# Patient Record
Sex: Female | Born: 1980 | Race: Black or African American | Hispanic: No | Marital: Married | Smoking: Never smoker
Health system: Southern US, Community
[De-identification: ages and names within clinical notes are randomized; demographics above are authoritative.]

## PROBLEM LIST (undated history)

## (undated) DIAGNOSIS — D649 Anemia, unspecified: Secondary | ICD-10-CM

---

## 1999-05-21 ENCOUNTER — Emergency Department (HOSPITAL_COMMUNITY): Admission: EM | Admit: 1999-05-21 | Discharge: 1999-05-21 | Payer: Self-pay | Admitting: Emergency Medicine

## 1999-05-21 ENCOUNTER — Encounter: Payer: Self-pay | Admitting: Emergency Medicine

## 1999-05-27 ENCOUNTER — Emergency Department (HOSPITAL_COMMUNITY): Admission: EM | Admit: 1999-05-27 | Discharge: 1999-05-27 | Payer: Self-pay | Admitting: Emergency Medicine

## 2002-09-18 ENCOUNTER — Emergency Department (HOSPITAL_COMMUNITY): Admission: EM | Admit: 2002-09-18 | Discharge: 2002-09-18 | Payer: Self-pay | Admitting: Emergency Medicine

## 2010-06-04 ENCOUNTER — Ambulatory Visit: Payer: Self-pay | Admitting: Diagnostic Radiology

## 2010-06-04 ENCOUNTER — Emergency Department (HOSPITAL_BASED_OUTPATIENT_CLINIC_OR_DEPARTMENT_OTHER): Admission: EM | Admit: 2010-06-04 | Discharge: 2010-06-04 | Payer: Self-pay | Admitting: Emergency Medicine

## 2011-08-06 ENCOUNTER — Emergency Department (HOSPITAL_BASED_OUTPATIENT_CLINIC_OR_DEPARTMENT_OTHER)
Admission: EM | Admit: 2011-08-06 | Discharge: 2011-08-06 | Disposition: A | Discharge status: Home / Self Care | Payer: Self-pay | Attending: Emergency Medicine | Admitting: Emergency Medicine

## 2011-08-06 DIAGNOSIS — B349 Viral infection, unspecified: Secondary | ICD-10-CM

## 2011-08-06 DIAGNOSIS — B9789 Other viral agents as the cause of diseases classified elsewhere: Secondary | ICD-10-CM | POA: Insufficient documentation

## 2011-08-06 DIAGNOSIS — IMO0001 Reserved for inherently not codable concepts without codable children: Secondary | ICD-10-CM | POA: Insufficient documentation

## 2011-08-06 LAB — URINALYSIS, ROUTINE W REFLEX MICROSCOPIC
Bilirubin Urine: NEGATIVE
Glucose, UA: NEGATIVE mg/dL
Hgb urine dipstick: NEGATIVE
Ketones, ur: NEGATIVE mg/dL
Leukocytes, UA: NEGATIVE
Nitrite: NEGATIVE
Protein, ur: NEGATIVE mg/dL
Specific Gravity, Urine: 1.023 (ref 1.005–1.030)
Urobilinogen, UA: 0.2 mg/dL (ref 0.0–1.0)
pH: 5.5 (ref 5.0–8.0)

## 2011-08-06 LAB — PREGNANCY, URINE: Preg Test, Ur: NEGATIVE

## 2011-08-06 LAB — RAPID STREP SCREEN (MED CTR MEBANE ONLY): Streptococcus, Group A Screen (Direct): NEGATIVE

## 2011-08-06 MED ORDER — OSELTAMIVIR PHOSPHATE 75 MG PO CAPS: 75.0000 mg | ORAL_CAPSULE | Freq: Two times a day (BID) | ORAL | Status: AC

## 2011-08-06 MED ORDER — KETOROLAC TROMETHAMINE 30 MG/ML IJ SOLN
30.0000 mg | Freq: Once | INTRAMUSCULAR | Status: AC
  Administered 2011-08-06: 30 mg via INTRAVENOUS
  Filled 2011-08-06: qty 1

## 2011-08-06 MED ORDER — SODIUM CHLORIDE 0.9 % IV BOLUS (SEPSIS)
1000.0000 mL | Freq: Once | INTRAVENOUS | Status: AC
  Administered 2011-08-06: 1000 mL via INTRAVENOUS

## 2011-08-06 MED ORDER — IBUPROFEN 600 MG PO TABS: 600.0000 mg | ORAL_TABLET | Freq: Four times a day (QID) | ORAL | Status: AC | PRN

## 2011-08-06 NOTE — ED Notes (Signed)
Pt states that she had sore throat on Thursday, Friday developed chills, fever, and today has generalized body aches, congestion of head and coughing.

## 2011-08-06 NOTE — ED Provider Notes (Addendum)
History     CSN: [id number] Arrival date & time: 08/06/2011  6:18 AM   First MD Initiated Contact with Patient 08/06/11 [phone]      Chief Complaint  Patient presents with  . Influenza   HPI  30yo female previously healthy presents with several complaints. The patient states that 2 days ago she developed a sore throat, fevers which are subjective, chills. She has generalized body aches. She states that when she coughs hard enough she does develop a headache. She complains of rhinorrhea nasal congestion. She denies neck stiffness. She's had multiple sick contacts at her job. She's been taking ibuprofen without relief of symptoms. Denies abdominal pain, nausea, vomiting. Denies pain in her ears. Denies hematuria/dysuria/freq/urgency. Denies back pain.  History reviewed. No pertinent past medical history.  Past Surgical History  Procedure Date  . Cesarean section     History reviewed. No pertinent family history.  History  Substance Use Topics  . Smoking status: Never Smoker   . Smokeless tobacco: Never Used  . Alcohol Use: Yes     occasional use    OB History    Grav Para Term Preterm Abortions TAB SAB Ect Mult Living                  Review of Systems  All other systems reviewed and are negative.  except as noted HPI   Allergies  Review of patient's allergies indicates no known allergies.  Home Medications   Current Outpatient Rx  Name Route Sig Dispense Refill  . COLD AND FLU PO Oral Take 5 mLs by mouth 3 (three) times daily as needed.      . IBUPROFEN 600 MG PO TABS Oral Take 1 tablet (600 mg total) by mouth every 6 (six) hours as needed for pain. 30 tablet 0  . OSELTAMIVIR PHOSPHATE 75 MG PO CAPS Oral Take 1 capsule (75 mg total) by mouth every 12 (twelve) hours. 10 capsule 0    BP 115/78  Pulse 116  Temp(Src) 98.7 F (37.1 C) (Oral)  Resp 20  Ht 5\' 2"  (1.575 m)  Wt 215 lb (97.523 kg)  BMI 39.32 kg/m2  SpO2 98%  LMP 07/09/2011  Physical Exam  Nursing  note and vitals reviewed. Constitutional: She is oriented to person, place, and time. She appears well-developed.  HENT:  Head: Atraumatic.  Mouth/Throat: No oropharyngeal exudate.       Nasal congestion Mild posterior OP erythema TM clear b/l  Eyes: Conjunctivae and EOM are normal. Pupils are equal, round, and reactive to light. Right eye exhibits no discharge. Left eye exhibits no discharge.  Neck: Normal range of motion. Neck supple.  Cardiovascular: Regular rhythm, normal heart sounds and intact distal pulses.  Exam reveals no gallop and no friction rub.   No murmur heard.      tachycardic  Pulmonary/Chest: Effort normal and breath sounds normal. No respiratory distress. She has no wheezes. She has no rales.  Abdominal: Soft. She exhibits no distension. There is no tenderness. There is no rebound and no guarding.  Musculoskeletal: Normal range of motion.  Lymphadenopathy:    She has cervical adenopathy.  Neurological: She is alert and oriented to person, place, and time.  Skin: Skin is warm and dry. No rash noted.  Psychiatric: She has a normal mood and affect.    ED Course  Procedures (including critical care time)  Labs Reviewed  URINALYSIS, ROUTINE W REFLEX MICROSCOPIC - Abnormal; Notable for the following:    APPearance  CLOUDY (*)    All other components within normal limits  PREGNANCY, URINE  RAPID STREP SCREEN  STREP A DNA PROBE   No results found.   1. Viral syndrome     MDM  Viral syndrome v/s strep/mono pharyngitis. Will check rapid strep, U/A, urine preg. IVF and toradol for supportive care given tachycardia. PMD referral.     Forbes Cellar, MD 08/06/11 [phone]    Labs reviewed and unremarkable. Tachycardia improved with IVF, toradol. Sx present for less than 48 hours. Will discharge with tamiflu and ibuprofen.Needs pmd f/u  BP 109/60  Pulse 90  Temp(Src) 98.9 F (37.2 C) (Oral)  Resp 19  Ht 5\' 2"  (1.575 m)  Wt 215 lb (97.523 kg)  BMI 39.32  kg/m2  SpO2 100%  LMP 07/09/2011   Forbes Cellar, MD 08/06/11 [phone]

## 2015-10-23 ENCOUNTER — Encounter (HOSPITAL_BASED_OUTPATIENT_CLINIC_OR_DEPARTMENT_OTHER): Payer: Self-pay

## 2015-10-23 ENCOUNTER — Emergency Department (HOSPITAL_BASED_OUTPATIENT_CLINIC_OR_DEPARTMENT_OTHER)
Admission: EM | Admit: 2015-10-23 | Discharge: 2015-10-23 | Disposition: A | Discharge status: Home / Self Care | Payer: Self-pay | Attending: Emergency Medicine | Admitting: Emergency Medicine

## 2015-10-23 DIAGNOSIS — K0889 Other specified disorders of teeth and supporting structures: Secondary | ICD-10-CM | POA: Insufficient documentation

## 2015-10-23 MED ORDER — ACETAMINOPHEN 325 MG PO TABS
650.0000 mg | ORAL_TABLET | Freq: Once | ORAL | Status: AC
  Administered 2015-10-23: 650 mg via ORAL
  Filled 2015-10-23: qty 2

## 2015-10-23 MED ORDER — PENICILLIN V POTASSIUM 500 MG PO TABS: 500.0000 mg | ORAL_TABLET | Freq: Four times a day (QID) | ORAL | Status: DC

## 2015-10-23 MED ORDER — NAPROXEN 250 MG PO TABS: 250.0000 mg | ORAL_TABLET | Freq: Two times a day (BID) | ORAL | Status: DC

## 2015-10-23 MED FILL — NAPROXEN 250 MG TABLET: 250 | 15 days supply | Qty: 30 | Fill #0

## 2015-10-23 MED FILL — PENICILLIN VK 500 MG TABLET: 500 | 10 days supply | Qty: 40 | Fill #0

## 2015-10-23 NOTE — ED Notes (Signed)
Left upper toothache x 2 days.  

## 2015-10-23 NOTE — ED Provider Notes (Signed)
CSN: [id number]     Arrival date & time 10/23/15  1240 History   First MD Initiated Contact with Patient 10/23/15 1312     Chief Complaint  Patient presents with  . Dental Pain    Kelli Brooks is a 35 y.o. female who presents to the emergency department complaining of dental pain ongoing for several months which is worsened over the past 2 days. She complains of 8 out of 10 left upper dental pain currently. She reports her pain is worse with cold liquids and with eating. She has taken nothing for treatment of her pain today. She denies having a dental provider. She denies fevers, trouble opening her mouth, discharge from her mouth, sore throat, trouble swallowing, neck pain, neck stiffness, ear pain or ear discharge.  Patient is a 35 y.o. female presenting with tooth pain. The history is provided by the patient. No language interpreter was used.  Dental Pain Associated symptoms: no congestion, no drooling, no facial swelling, no fever, no headaches, no neck pain and no oral lesions     History reviewed. No pertinent past medical history. Past Surgical History  Procedure Laterality Date  . Cesarean section     No family history on file. Social History  Substance Use Topics  . Smoking status: Never Smoker   . Smokeless tobacco: Never Used  . Alcohol Use: Yes   OB History    No data available     Review of Systems  Constitutional: Negative for fever and chills.  HENT: Positive for dental problem. Negative for congestion, drooling, ear discharge, ear pain, facial swelling, mouth sores, sore throat and trouble swallowing.   Eyes: Negative for visual disturbance.  Gastrointestinal: Negative for nausea and vomiting.  Musculoskeletal: Negative for neck pain and neck stiffness.  Skin: Negative for rash.  Neurological: Negative for syncope, light-headedness and headaches.      Allergies  Review of patient's allergies indicates no known allergies.  Home Medications   Prior to  Admission medications   Medication Sig Start Date End Date Taking? Authorizing Provider  naproxen (NAPROSYN) 250 MG tablet Take 1 tablet (250 mg total) by mouth 2 (two) times daily with a meal. 10/23/15   Everlene Farrier, PA-C  penicillin v potassium (VEETID) 500 MG tablet Take 1 tablet (500 mg total) by mouth 4 (four) times daily. 10/23/15   Everlene Farrier, PA-C   BP 140/78 mmHg  Pulse 69  Temp(Src) 98.6 F (37 C) (Oral)  Resp 16  Ht  (1.575 m)  Wt 86.183 kg  BMI 34.74 kg/m2  SpO2 100%  LMP 10/15/2015 Physical Exam  Constitutional: She is oriented to person, place, and time. She appears well-developed and well-nourished. No distress.  Non-toxic appearing.   HENT:  Head: Normocephalic and atraumatic.  Right Ear: External ear normal.  Left Ear: External ear normal.  Mouth/Throat: Oropharynx is clear and moist. No oropharyngeal exudate.  Tenderness to left upper tooth. Generally poor dentition.  No discharge from the mouth. No facial swelling.  Uvula is midline without edema. Soft palate rises symmetrically. No tonsillar hypertrophy or exudates. Tongue protrusion is normal. No trismus. Bilateral tympanic membranes are pearly-gray without erythema or loss of landmarks.   Eyes: Conjunctivae are normal. Pupils are equal, round, and reactive to light. Right eye exhibits no discharge. Left eye exhibits no discharge.  Neck: Normal range of motion. Neck supple. No JVD present. No tracheal deviation present.  Cardiovascular: Normal rate, regular rhythm, normal heart sounds and intact distal pulses.  Pulmonary/Chest: Effort normal and breath sounds normal. No respiratory distress. She has no wheezes. She has no rales.  Abdominal: Soft. There is no tenderness.  Lymphadenopathy:    She has no cervical adenopathy.  Neurological: She is alert and oriented to person, place, and time. No cranial nerve deficit. Coordination normal.  Skin: Skin is warm and dry. No rash noted. She is not  diaphoretic. No erythema. No pallor.  Psychiatric: She has a normal mood and affect. Her behavior is normal.  Nursing note and vitals reviewed.   ED Course  Procedures (including critical care time) Labs Review Labs Reviewed - No data to display  Imaging Review No results found.    EKG Interpretation None      Filed Vitals:   10/23/15 1248  BP: 140/78  Pulse: 69  Temp: 98.6 F (37 C)  TempSrc: Oral  Resp: 16  Height:  (1.575 m)  Weight: 86.183 kg  SpO2: 100%     MDM   Meds given in ED:  Medications  acetaminophen (TYLENOL) tablet 650 mg (650 mg Oral Given 10/23/15 1345)    Discharge Medication List as of 10/23/2015  1:44 PM    START taking these medications   Details  naproxen (NAPROSYN) 250 MG tablet Take 1 tablet (250 mg total) by mouth 2 (two) times daily with a meal., Starting 10/23/2015, Until Discontinued, Print    penicillin v potassium (VEETID) 500 MG tablet Take 1 tablet (500 mg total) by mouth 4 (four) times daily., Starting 10/23/2015, Until Discontinued, Print        Final diagnoses:  Pain, dental   This  is a 35 y.o. female who presents to the emergency department complaining of dental pain ongoing for several months which is worsened over the past 2 days. Patient with toothache.  No gross abscess.  Exam unconcerning for Ludwig's angina or spread of infection.  Will treat with penicillin and pain medicine.  Urged patient to follow-up with dentist.  Patient provided with resource guide. I advised the patient to follow-up with their primary care provider this week. I advised the patient to return to the emergency department with new or worsening symptoms or new concerns. The patient verbalized understanding and agreement with plan.        Everlene Farrier, PA-C 10/23/15 1350  Pricilla Loveless, MD 10/26/15 [phone]

## 2015-10-23 NOTE — Discharge Instructions (Signed)

## 2016-06-13 ENCOUNTER — Emergency Department (HOSPITAL_BASED_OUTPATIENT_CLINIC_OR_DEPARTMENT_OTHER)
Admission: EM | Admit: 2016-06-13 | Discharge: 2016-06-13 | Disposition: A | Discharge status: Home / Self Care | Payer: Self-pay | Attending: Emergency Medicine | Admitting: Emergency Medicine

## 2016-06-13 ENCOUNTER — Emergency Department (HOSPITAL_BASED_OUTPATIENT_CLINIC_OR_DEPARTMENT_OTHER): Payer: Self-pay

## 2016-06-13 ENCOUNTER — Encounter (HOSPITAL_BASED_OUTPATIENT_CLINIC_OR_DEPARTMENT_OTHER): Payer: Self-pay | Admitting: Emergency Medicine

## 2016-06-13 DIAGNOSIS — D509 Iron deficiency anemia, unspecified: Secondary | ICD-10-CM | POA: Insufficient documentation

## 2016-06-13 DIAGNOSIS — J209 Acute bronchitis, unspecified: Secondary | ICD-10-CM | POA: Insufficient documentation

## 2016-06-13 LAB — CBC
HEMATOCRIT: 28.4 % — AB (ref 36.0–46.0)
Hemoglobin: 8.3 g/dL — ABNORMAL LOW (ref 12.0–15.0)
MCH: 18 pg — AB (ref 26.0–34.0)
MCHC: 29.2 g/dL — ABNORMAL LOW (ref 30.0–36.0)
MCV: 61.6 fL — AB (ref 78.0–100.0)
PLATELETS: 455 10*3/uL — AB (ref 150–400)
RBC: 4.61 MIL/uL (ref 3.87–5.11)
RDW: 21.4 % — ABNORMAL HIGH (ref 11.5–15.5)
WBC: 10.6 10*3/uL — AB (ref 4.0–10.5)

## 2016-06-13 LAB — BASIC METABOLIC PANEL
Anion gap: 10 (ref 5–15)
BUN: 9 mg/dL (ref 6–20)
CHLORIDE: 106 mmol/L (ref 101–111)
CO2: 21 mmol/L — AB (ref 22–32)
CREATININE: 0.97 mg/dL (ref 0.44–1.00)
Calcium: 9.2 mg/dL (ref 8.9–10.3)
GFR calc Af Amer: >60 mL/min (ref >60)
GFR calc non Af Amer: >60 mL/min (ref >60)
Glucose, Bld: 92 mg/dL (ref 65–99)
POTASSIUM: 3.2 mmol/L — AB (ref 3.5–5.1)
SODIUM: 137 mmol/L (ref 135–145)

## 2016-06-13 LAB — TROPONIN I: Troponin I: <0.03 ng/mL (ref <0.03)

## 2016-06-13 MED ORDER — AZITHROMYCIN 250 MG PO TABS: ORAL_TABLET | ORAL | 0 refills | Status: DC

## 2016-06-13 NOTE — ED Provider Notes (Signed)
[facility]   CSN: [id number] Arrival date & time: 06/13/16  1954   By signing my name below, I, Teofilo PodMatthew P. Jamison, attest that this documentation has been prepared under the direction and in the presence of Geoffery Lyonsouglas Ilya Neely, MD . Electronically Signed: Teofilo PodMatthew P. Jamison, ED Scribe. 06/13/2016. 9:48 PM.   History   Chief Complaint Chief Complaint  Patient presents with  . Cough    The history is provided by the patient. No language interpreter was used.  Cough Associated symptoms include chest pain and shortness of breath. Her past medical history does not include asthma.  Shortness of Breath  This is a new problem. The average episode lasts 3 days. The problem occurs continuously.The problem has not changed since onset.Associated symptoms include cough and chest pain. She has tried nothing for the symptoms. Associated medical issues do not include asthma.   HPI Comments:  Kelli RoupCrystal Brooks is a 35 y.o. female who presents to the Emergency Department complaining of gradual onset, worsening SOB x3 days. Pt complains of associated productive cough and chest pain. Pt states that her chest pain is exacerbated when taking deep breaths. Pt reports PMHx of irregular periods. Pt is not a smoker. No alleviating factors noted. Pt denies other associated symptoms.   History reviewed. No pertinent past medical history.  There are no active problems to display for this patient.   Past Surgical History:  Procedure Laterality Date  . CESAREAN SECTION      OB History    No data available       Home Medications    Prior to Admission medications   Medication Sig Start Date End Date Taking? Authorizing Provider  naproxen (NAPROSYN) 250 MG tablet Take 1 tablet (250 mg total) by mouth 2 (two) times daily with a meal. 10/23/15   Everlene FarrierWilliam Dansie, PA-C  penicillin v potassium (VEETID) 500 MG tablet Take 1 tablet (500 mg total) by mouth 4 (four) times daily. 10/23/15    Everlene FarrierWilliam Dansie, PA-C    Family History History reviewed. No pertinent family history.  Social History Social History  Substance Use Topics  . Smoking status: Never Smoker  . Smokeless tobacco: Never Used  . Alcohol use Yes     Allergies   Review of patient's allergies indicates no known allergies.   Review of Systems Review of Systems  Respiratory: Positive for cough and shortness of breath.   Cardiovascular: Positive for chest pain.  All other systems reviewed and are negative.    Physical Exam Updated Vital Signs BP 119/73 (BP Location: Right Arm)   Pulse 95   Temp 98.3 F (36.8 C) (Oral)   Resp 22   Ht 5\' 2"  (1.575 m)   Wt 218 lb (98.9 kg)   LMP 06/05/2016   SpO2 100%   BMI 39.87 kg/m   Physical Exam  Constitutional: She is oriented to person, place, and time. She appears well-developed and well-nourished. No distress.  HENT:  Head: Normocephalic and atraumatic.  Mouth/Throat: Oropharynx is clear and moist.  Eyes: Conjunctivae are normal.  Neck: Normal range of motion. Neck supple.  Cardiovascular: Normal rate, regular rhythm and normal heart sounds.   No murmur heard. Pulmonary/Chest: Effort normal and breath sounds normal. No respiratory distress. She has no wheezes. She has no rales.  Abdominal: Soft. She exhibits no distension. There is no tenderness.  Musculoskeletal: Normal range of motion.  Neurological: She is alert and oriented to person, place, and time.  Skin: Skin is  warm and dry.  Psychiatric: She has a normal mood and affect.  Nursing note and vitals reviewed.    ED Treatments / Results  DIAGNOSTIC STUDIES:  Oxygen Saturation is 100% on RA, normal by my interpretation.    COORDINATION OF CARE:  9:48 PM Discussed treatment plan with pt at bedside and pt agreed to plan.   Labs (all labs ordered are listed, but only abnormal results are displayed) Labs Reviewed  BASIC METABOLIC PANEL - Abnormal; Notable for the following:        Result Value   Potassium 3.2 (*)    CO2 21 (*)    All other components within normal limits  CBC - Abnormal; Notable for the following:    WBC 10.6 (*)    Hemoglobin 8.3 (*)    HCT 28.4 (*)    MCV 61.6 (*)    MCH 18.0 (*)    MCHC 29.2 (*)    RDW 21.4 (*)    Platelets 455 (*)    All other components within normal limits  TROPONIN I    EKG  EKG Interpretation  Date/Time:  Monday June 13 2016 20:00:25 EDT Ventricular Rate:  99 PR Interval:  144 QRS Duration: 70 QT Interval:  324 QTC Calculation: 415 R Axis:   50 Text Interpretation:  Normal sinus rhythm Cannot rule out Anterior infarct , age undetermined Abnormal ECG Confirmed by Andrei Mccook  MD, Venus Ruhe ([id number]) on 06/13/2016 9:45:40 PM       Radiology Dg Chest 2 View  Result Date: 06/13/2016 CLINICAL DATA:  Mid chest pain, dizziness and shortness of breath for 3 days. Chest congestion. EXAM: CHEST  2 VIEW COMPARISON:  None. FINDINGS: Lungs are clear. Heart size is normal. No pneumothorax or pleural effusion. No bony abnormality. IMPRESSION: Negative chest. Electronically Signed   By: Drusilla Kanner M.D.   On: 06/13/2016 20:54    Procedures Procedures (including critical care time)  Medications Ordered in ED Medications - No data to display   Initial Impression / Assessment and Plan / ED Course  I have reviewed the triage vital signs and the nursing notes.  Pertinent labs & imaging results that were available during my care of the patient were reviewed by me and considered in my medical decision making (see chart for details).  Clinical Course    Workup reveals no evidence for a cardiac etiology. She reports reductive cough for the past several days. I suspect a viral etiology, however an acute bronchitis cannot be excluded. She will be given a prescription for Zithromax and is advised to take this if she is not improving in the next 2 days.  Final Clinical Impressions(s) / ED Diagnoses   Final diagnoses:  None     New Prescriptions New Prescriptions   No medications on file  I personally performed the services described in this documentation, which was scribed in my presence. The recorded information has been reviewed and is accurate.        Geoffery Lyons, MD 06/13/16 2256

## 2016-06-13 NOTE — ED Triage Notes (Signed)
Patient states that she has had pain to her chest with SOb and cough x 2 days

## 2016-06-13 NOTE — ED Notes (Signed)
Pt verbalizes understanding of d/c instructions and denies any further needs at this time. 

## 2016-06-13 NOTE — Discharge Instructions (Signed)
Fill the prescription for Zithromax you have been given if your symptoms are not improving in the next 2-3 days.  Begin taking a multivitamin with iron and follow-up with your primary Dr. regarding your anemia.  Return to the emergency department if your symptoms significantly worsen or change.

## 2017-03-08 ENCOUNTER — Emergency Department (HOSPITAL_BASED_OUTPATIENT_CLINIC_OR_DEPARTMENT_OTHER): Payer: Self-pay

## 2017-03-08 ENCOUNTER — Encounter (HOSPITAL_BASED_OUTPATIENT_CLINIC_OR_DEPARTMENT_OTHER): Payer: Self-pay | Admitting: Emergency Medicine

## 2017-03-08 ENCOUNTER — Emergency Department (HOSPITAL_BASED_OUTPATIENT_CLINIC_OR_DEPARTMENT_OTHER)
Admission: EM | Admit: 2017-03-08 | Discharge: 2017-03-08 | Disposition: A | Discharge status: Home / Self Care | Payer: Self-pay | Attending: Emergency Medicine | Admitting: Emergency Medicine

## 2017-03-08 DIAGNOSIS — M25562 Pain in left knee: Secondary | ICD-10-CM | POA: Insufficient documentation

## 2017-03-08 DIAGNOSIS — R202 Paresthesia of skin: Secondary | ICD-10-CM | POA: Insufficient documentation

## 2017-03-08 DIAGNOSIS — Z79899 Other long term (current) drug therapy: Secondary | ICD-10-CM | POA: Insufficient documentation

## 2017-03-08 DIAGNOSIS — M25561 Pain in right knee: Secondary | ICD-10-CM | POA: Insufficient documentation

## 2017-03-08 DIAGNOSIS — G8929 Other chronic pain: Secondary | ICD-10-CM | POA: Insufficient documentation

## 2017-03-08 DIAGNOSIS — R200 Anesthesia of skin: Secondary | ICD-10-CM | POA: Insufficient documentation

## 2017-03-08 DIAGNOSIS — M79673 Pain in unspecified foot: Secondary | ICD-10-CM

## 2017-03-08 LAB — CBG MONITORING, ED: Glucose-Capillary: 91 mg/dL (ref 65–99)

## 2017-03-08 MED ORDER — IBUPROFEN 800 MG PO TABS: 800.0000 mg | ORAL_TABLET | Freq: Three times a day (TID) | ORAL | 0 refills | Status: DC | PRN

## 2017-03-08 MED ORDER — PREDNISONE 50 MG PO TABS: 50.0000 mg | ORAL_TABLET | Freq: Every day | ORAL | 0 refills | Status: DC

## 2017-03-08 MED ORDER — TRAMADOL HCL 50 MG PO TABS: 50.0000 mg | ORAL_TABLET | Freq: Four times a day (QID) | ORAL | 0 refills | Status: DC | PRN

## 2017-03-08 NOTE — ED Provider Notes (Signed)
[facility]   CSN: [id number] Arrival date & time: 03/08/17  0931     History   Chief Complaint Chief Complaint  Patient presents with  . Knee Pain    HPI Kelli Brooks is a 36 y.o. female.  HPI Patient presents to the emergency department with bilateral knee discomfort that appears to be chronic.  She states that last night she developed shooting pain and her feet with tingling.  She states that she has never had this type of tingling in her foot.  She states the bottom of her feet over she feels the tingling pain.  Patient states that nothing seems make the condition better, movement and palpation makes the pain worse.  Patient states that she did not take any medications prior to arrival.  She states that she does not recall any specific injury.  She states she does stand a lot with work History reviewed. No pertinent past medical history.  There are no active problems to display for this patient.   Past Surgical History:  Procedure Laterality Date  . CESAREAN SECTION      OB History    No data available       Home Medications    Prior to Admission medications   Medication Sig Start Date End Date Taking? Authorizing Provider  azithromycin (ZITHROMAX Z-PAK) 250 MG tablet 2 po day one, then 1 daily x 4 days 06/13/16   Geoffery Lyons, MD  naproxen (NAPROSYN) 250 MG tablet Take 1 tablet (250 mg total) by mouth 2 (two) times daily with a meal. 10/23/15   Everlene Farrier, PA-C  penicillin v potassium (VEETID) 500 MG tablet Take 1 tablet (500 mg total) by mouth 4 (four) times daily. 10/23/15   Everlene Farrier, PA-C    Family History No family history on file.  Social History Social History  Substance Use Topics  . Smoking status: Never Smoker  . Smokeless tobacco: Never Used  . Alcohol use Yes     Allergies   Patient has no known allergies.   Review of Systems Review of Systems  All other systems negative except as documented in  the HPI. All pertinent positives and negatives as reviewed in the HPI.  Physical Exam Updated Vital Signs BP (!) 149/99 (BP Location: Left Arm)   Pulse 88   Temp 98.3 F (36.8 C) (Oral)   Resp 18   Ht 5\' 2"  (1.575 m)   Wt 90.7 kg (200 lb)   LMP 02/21/2017   SpO2 98%   BMI 36.58 kg/m   Physical Exam  Constitutional: She is oriented to person, place, and time. She appears well-developed and well-nourished. No distress.  HENT:  Head: Normocephalic and atraumatic.  Eyes: Pupils are equal, round, and reactive to light.  Pulmonary/Chest: Effort normal.  Musculoskeletal:       Right knee: She exhibits normal range of motion, no swelling, no effusion, no ecchymosis, no deformity, no erythema and no bony tenderness.       Left knee: She exhibits normal range of motion, no swelling, no effusion, no ecchymosis, no deformity, no erythema and no bony tenderness.       Feet:  Neurological: She is alert and oriented to person, place, and time.  Skin: Skin is warm and dry.  Psychiatric: She has a normal mood and affect.  Nursing note and vitals reviewed.    ED Treatments / Results  Labs (all labs ordered are listed, but only abnormal results are displayed) Labs  Reviewed - No data to display  EKG  EKG Interpretation None       Radiology No results found.  Procedures Procedures (including critical care time)  Medications Ordered in ED Medications - No data to display   Initial Impression / Assessment and Plan / ED Course  I have reviewed the triage vital signs and the nursing notes.  Pertinent labs & imaging results that were available during my care of the patient were reviewed by me and considered in my medical decision making (see chart for details).     The patient's x-rays do not show any abnormality.  The patient does have pain along the plantar aspect of her feet, which could be related to her standing and walking at work.  We will give her anti-inflammatories,  told to use ice and heat on the areas that are sore.  We will give her orthopedic follow-up as well  Final Clinical Impressions(s) / ED Diagnoses   Final diagnoses:  None    New Prescriptions New Prescriptions   No medications on file     Charlestine NightLawyer, [place], Cordelia Poche-C 03/08/17 1042    Jacalyn LefevreHaviland, Julie, MD 03/08/17 1100

## 2017-03-08 NOTE — ED Triage Notes (Signed)
Pt reports bilateral knee pain that woke her up in the night last night.  States pain is worse with movements and that she has had some pins and needles in her feet also since the pain started last night.  Pt reports bilateral knee pains that come and go for past few years, but that usually it is not so intense.

## 2017-03-08 NOTE — Discharge Instructions (Signed)
Your x-rays were normal. Follow up with the orthopedist provided. Use ice and heat on the areas that are sore

## 2017-10-14 ENCOUNTER — Other Ambulatory Visit: Payer: Self-pay

## 2017-10-14 ENCOUNTER — Emergency Department (HOSPITAL_BASED_OUTPATIENT_CLINIC_OR_DEPARTMENT_OTHER)
Admission: EM | Admit: 2017-10-14 | Discharge: 2017-10-14 | Disposition: A | Discharge status: Home / Self Care | Payer: BLUE CROSS/BLUE SHIELD | Attending: Emergency Medicine | Admitting: Emergency Medicine

## 2017-10-14 ENCOUNTER — Emergency Department (HOSPITAL_BASED_OUTPATIENT_CLINIC_OR_DEPARTMENT_OTHER): Payer: BLUE CROSS/BLUE SHIELD

## 2017-10-14 ENCOUNTER — Encounter (HOSPITAL_BASED_OUTPATIENT_CLINIC_OR_DEPARTMENT_OTHER): Payer: Self-pay | Admitting: Emergency Medicine

## 2017-10-14 DIAGNOSIS — M25561 Pain in right knee: Secondary | ICD-10-CM | POA: Insufficient documentation

## 2017-10-14 DIAGNOSIS — Z79899 Other long term (current) drug therapy: Secondary | ICD-10-CM | POA: Diagnosis not present

## 2017-10-14 MED ORDER — IBUPROFEN 400 MG PO TABS
400.0000 mg | ORAL_TABLET | Freq: Once | ORAL | Status: AC
Start: 2017-10-14 — End: 2017-10-14
  Administered 2017-10-14: 400 mg via ORAL
  Filled 2017-10-14: qty 1

## 2017-10-14 NOTE — ED Provider Notes (Addendum)
[facility] EMERGENCY DEPARTMENT Provider Note   CSN: [id number] Arrival date & time: 10/14/17  1002     History   Chief Complaint Chief Complaint  Patient presents with  . Knee Pain    HPI Kelli Brooks is a 37 y.o. female.  Planes of right anterior knee pain for the past 2 weeks.  Pain is worse worse with weightbearing and improved with nonweightbearing no fever.  No injury.  Also with swelling over anterior knee for the past 2 weeks.  No treatment prior to coming here.  No other associated symptoms.  She reports she has intermittent knee pain that goes "from one need to the other" for the past 1-2 years.  Has never sought medical care before.  HPI  History reviewed. No pertinent past medical history.  There are no active problems to display for this patient.   Past Surgical History:  Procedure Laterality Date  . CESAREAN SECTION      OB History    No data available       Home Medications    Prior to Admission medications   Medication Sig Start Date End Date Taking? Authorizing Provider  azithromycin (ZITHROMAX Z-PAK) 250 MG tablet 2 po day one, then 1 daily x 4 days 06/13/16   Geoffery Lyonselo, Douglas, MD  ibuprofen (ADVIL,MOTRIN) 800 MG tablet Take 1 tablet (800 mg total) by mouth every 8 (eight) hours as needed (start taking once you have finished the steroids). 03/08/17   Lawyer, Cristal Deerhristopher, PA-C  naproxen (NAPROSYN) 250 MG tablet Take 1 tablet (250 mg total) by mouth 2 (two) times daily with a meal. 10/23/15   Everlene Farrieransie, William, PA-C  penicillin v potassium (VEETID) 500 MG tablet Take 1 tablet (500 mg total) by mouth 4 (four) times daily. 10/23/15   Everlene Farrieransie, William, PA-C  predniSONE (DELTASONE) 50 MG tablet Take 1 tablet (50 mg total) by mouth daily with breakfast. 03/08/17   Lawyer, Cristal Deerhristopher, PA-C  traMADol (ULTRAM) 50 MG tablet Take 1 tablet (50 mg total) by mouth every 6 (six) hours as needed for severe pain. 03/08/17   Lawyer, Cristal Deerhristopher, PA-C    Family  History No family history on file.  Social History Social History   Tobacco Use  . Smoking status: Never Smoker  . Smokeless tobacco: Never Used  Substance Use Topics  . Alcohol use: Yes  . Drug use: No     Allergies   Patient has no known allergies.   Review of Systems Review of Systems  Constitutional: Negative.   HENT: Negative.   Respiratory: Negative.   Cardiovascular: Negative.   Gastrointestinal: Negative.   Musculoskeletal: Positive for arthralgias.  Skin: Negative.   Neurological: Negative.   Psychiatric/Behavioral: Negative.   All other systems reviewed and are negative.    Physical Exam Updated Vital Signs BP (!) 145/98 (BP Location: Left Arm)   Pulse 81   Temp 98.7 F (37.1 C) (Oral)   Resp 16   Ht 5\' 2"  (1.575 m)   Wt 90.7 kg (200 lb)   LMP 09/24/2017   SpO2 98%   BMI 36.58 kg/m   Physical Exam  Constitutional: She is oriented to person, place, and time. She appears well-developed and well-nourished.  HENT:  Head: Normocephalic and atraumatic.  Eyes: Conjunctivae are normal. Pupils are equal, round, and reactive to light.  Neck: Neck supple. No tracheal deviation present. No thyromegaly present.  Cardiovascular: Normal rate and regular rhythm.  No murmur heard. Pulmonary/Chest: Effort normal and breath sounds normal.  Abdominal: Soft. Bowel sounds are normal. She exhibits no distension. There is no tenderness.  Obese  Musculoskeletal: Normal range of motion. She exhibits no edema or tenderness.  Right lower extremity minimally swollen over prepatellar area no definite effusion.  Knee is minimally tender over prepatellar area.  It is not red or hot.  All other extremities without redness swelling or tenderness neurovascular intact.  Neurological: She is alert and oriented to person, place, and time. No cranial nerve deficit. Coordination normal.  Walks with limp favoring right leg  Skin: Skin is warm and dry. Capillary refill takes less than  2 seconds. No rash noted.  Psychiatric: She has a normal mood and affect.  Nursing note and vitals reviewed.    ED Treatments / Results  Labs (all labs ordered are listed, but only abnormal results are displayed) Labs Reviewed - No data to display  EKG  EKG Interpretation None       Radiology No results found.  Procedures Procedures (including critical care time)  Medications Ordered in ED Medications  ibuprofen (ADVIL,MOTRIN) tablet 400 mg (not administered)   X-ray viewed by me Results for orders placed or performed during the hospital encounter of 03/08/17  CBG monitoring, ED  Result Value Ref Range   Glucose-Capillary 91 65 - 99 mg/dL   Dg Knee Complete 4 Views Right  Result Date: 10/14/2017 CLINICAL DATA:  Right knee pain.  No injury EXAM: RIGHT KNEE - COMPLETE 4+ VIEW COMPARISON:  03/08/2017 FINDINGS: No evidence of fracture, dislocation, or joint effusion. No evidence of arthropathy or other focal bone abnormality. Soft tissues are unremarkable. IMPRESSION: Negative. Electronically Signed   By: Charlett Nose M.D.   On: 10/14/2017 10:56   Initial Impression / Assessment and Plan / ED Course  I have reviewed the triage vital signs and the nursing notes.  Pertinent labs & imaging results that were available during my care of the patient were reviewed by me and considered in my medical decision making (see chart for details).     11:50 AM pain improved after treatment with ibuprofen.  Plan crutches, ice, referral Dr. Pearletha Forge as needed  Final Clinical Impressions(s) / ED Diagnoses  Diagnosis  Acute right knee pain Final diagnoses:  None    ED Discharge Orders    None       Doug Sou, MD 10/14/17 1210    Doug Sou, MD 10/14/17 [phone]

## 2017-10-14 NOTE — ED Triage Notes (Signed)
R knee pain and swelling x 2 weeks. Denies injury.

## 2017-10-14 NOTE — Discharge Instructions (Signed)
Plan ice pack over your right knee 4 times daily for 30 minutes at a time.  Take Advil as directed for pain. use the crutches as needed for comfort.  If you continue to have significant pain or difficulty walking in 2 days call Dr.Hudnall schedule the next available office visit.  Tell office staff that you were seen here.

## 2020-04-02 ENCOUNTER — Emergency Department (HOSPITAL_BASED_OUTPATIENT_CLINIC_OR_DEPARTMENT_OTHER): Payer: Self-pay

## 2020-04-02 ENCOUNTER — Other Ambulatory Visit: Payer: Self-pay

## 2020-04-02 ENCOUNTER — Emergency Department (HOSPITAL_BASED_OUTPATIENT_CLINIC_OR_DEPARTMENT_OTHER)
Admission: EM | Admit: 2020-04-02 | Discharge: 2020-04-02 | Disposition: A | Discharge status: Home / Self Care | Payer: Self-pay | Attending: Emergency Medicine | Admitting: Emergency Medicine

## 2020-04-02 ENCOUNTER — Encounter (HOSPITAL_BASED_OUTPATIENT_CLINIC_OR_DEPARTMENT_OTHER): Payer: Self-pay

## 2020-04-02 DIAGNOSIS — Y999 Unspecified external cause status: Secondary | ICD-10-CM | POA: Insufficient documentation

## 2020-04-02 DIAGNOSIS — S99921A Unspecified injury of right foot, initial encounter: Secondary | ICD-10-CM | POA: Insufficient documentation

## 2020-04-02 DIAGNOSIS — Y939 Activity, unspecified: Secondary | ICD-10-CM | POA: Insufficient documentation

## 2020-04-02 DIAGNOSIS — Y9289 Other specified places as the place of occurrence of the external cause: Secondary | ICD-10-CM | POA: Insufficient documentation

## 2020-04-02 DIAGNOSIS — W010XXA Fall on same level from slipping, tripping and stumbling without subsequent striking against object, initial encounter: Secondary | ICD-10-CM | POA: Insufficient documentation

## 2020-04-02 DIAGNOSIS — M79604 Pain in right leg: Secondary | ICD-10-CM

## 2020-04-02 MED ORDER — ACETAMINOPHEN 325 MG PO TABS
650.0000 mg | ORAL_TABLET | Freq: Once | ORAL | Status: AC
  Administered 2020-04-02: 17:00:00 650 mg via ORAL
  Filled 2020-04-02: qty 2

## 2020-04-02 NOTE — ED Provider Notes (Signed)
[facility] EMERGENCY DEPARTMENT Provider Note   CSN: [id number] Arrival date & time: 04/02/20  1444     History Chief Complaint  Patient presents with  . Foot Injury    Kelli Brooks is a 39 y.o. female with no pertinent past medical history that presents the emergency department today for right foot pain. Patient states that she was seen in urgent care 6 days ago and diagnosed with a " slight fracture" and was prescribed a cam walker.  Unable to see those records at this time.  Patient states that since then her foot has been hurting worse, states that she has been using the cam walker as prescribed.  Has not been taking anything for this.  States that she is not going to follow-up with orthopedics due to financial concerns.  Denies any fevers, chills, nausea, vomiting, paresthesias, weakness.  Patient states that she fell 6 days ago when she was trying to get off her porch.  States that she twisted her ankle,   States that she thinks she also hit her shin, they did not do x-rays at this at the time she states.  Did not hit her head or anything else.  HPI     History reviewed. No pertinent past medical history.  There are no problems to display for this patient.   Past Surgical History:  Procedure Laterality Date  . CESAREAN SECTION       OB History   No obstetric history on file.     No family history on file.  Social History   Tobacco Use  . Smoking status: Never Smoker  . Smokeless tobacco: Never Used  Vaping Use  . Vaping Use: Never used  Substance Use Topics  . Alcohol use: Yes    Comment: occ  . Drug use: No    Home Medications Prior to Admission medications   Medication Sig Start Date End Date Taking? Authorizing Provider  ibuprofen (ADVIL,MOTRIN) 800 MG tablet Take 1 tablet (800 mg total) by mouth every 8 (eight) hours as needed (start taking once you have finished the steroids). 03/08/17   Lawyer, Cristal Deer, PA-C  naproxen (NAPROSYN) 250  MG tablet Take 1 tablet (250 mg total) by mouth 2 (two) times daily with a meal. 10/23/15   Everlene Farrier, PA-C  penicillin v potassium (VEETID) 500 MG tablet Take 1 tablet (500 mg total) by mouth 4 (four) times daily. 10/23/15   Everlene Farrier, PA-C  traMADol (ULTRAM) 50 MG tablet Take 1 tablet (50 mg total) by mouth every 6 (six) hours as needed for severe pain. 03/08/17   Charlestine Night, PA-C    Allergies    Patient has no known allergies.  Review of Systems   Review of Systems  Constitutional: Negative for diaphoresis, fatigue and fever.  Eyes: Negative for visual disturbance.  Respiratory: Negative for shortness of breath.   Cardiovascular: Negative for chest pain.  Gastrointestinal: Negative for nausea and vomiting.  Musculoskeletal: Positive for arthralgias (R pain in foot, ankle, leg ). Negative for back pain and myalgias.  Skin: Negative for color change, pallor, rash and wound.  Neurological: Negative for syncope, weakness, light-headedness, numbness and headaches.  Psychiatric/Behavioral: Negative for behavioral problems and confusion.    Physical Exam Updated Vital Signs BP (!) 130/88 (BP Location: Left Arm)   Pulse 75   Temp 99.1 F (37.3 C) (Oral)   Resp 16   Ht 5\' 2"  (1.575 m)   Wt 88.5 kg   LMP 03/04/2020  SpO2 100%   BMI 35.67 kg/m   Physical Exam Constitutional:      General: She is not in acute distress.    Appearance: Normal appearance. She is not ill-appearing, toxic-appearing or diaphoretic.  HENT:     Mouth/Throat:     Mouth: Mucous membranes are moist.     Pharynx: Oropharynx is clear.  Eyes:     General: No scleral icterus.    Extraocular Movements: Extraocular movements intact.     Pupils: Pupils are equal, round, and reactive to light.  Cardiovascular:     Rate and Rhythm: Normal rate and regular rhythm.     Pulses: Normal pulses.     Heart sounds: Normal heart sounds.  Pulmonary:     Effort: Pulmonary effort is normal. No  respiratory distress.     Breath sounds: Normal breath sounds. No stridor. No wheezing, rhonchi or rales.  Chest:     Chest wall: No tenderness.  Abdominal:     General: Abdomen is flat. There is no distension.     Palpations: Abdomen is soft.     Tenderness: There is no abdominal tenderness. There is no guarding or rebound.  Musculoskeletal:        General: Tenderness present. No swelling. Normal range of motion.     Cervical back: Normal range of motion and neck supple. No rigidity.     Right lower leg: No edema.     Left lower leg: No edema.     Comments: Right side with tenderness over midfoot, no erythema or swelling noticed.  No ecchymosis noted.  No warmth noticed.  PT pulses 2+ and equal, used with Doppler.  Normal strength to toes, ankle, leg.  Patient is also having tenderness over shin, no tenderness to knee.  Compartments soft, normal leg raise.  Able to range leg in all directions without pain.  No ecchymosis or temperature changes or pallor noted over shin.  Normal strength of knee.  Normal sensation throughout.  Left side normal.  Skin:    General: Skin is warm and dry.     Capillary Refill: Capillary refill takes less than 2 seconds.     Coloration: Skin is not pale.  Neurological:     General: No focal deficit present.     Mental Status: She is alert and oriented to person, place, and time.     Cranial Nerves: No cranial nerve deficit.     Sensory: No sensory deficit.     Motor: No weakness.     Coordination: Coordination normal.     Gait: Gait normal.  Psychiatric:        Mood and Affect: Mood normal.        Behavior: Behavior normal.     ED Results / Procedures / Treatments   Labs (all labs ordered are listed, but only abnormal results are displayed) Labs Reviewed - No data to display  EKG None  Radiology DG Tibia/Fibula Right  Result Date: 04/02/2020 CLINICAL DATA:  Fall from porch after tripping over object. Right foot, ankle, and lower leg pain. EXAM:  RIGHT TIBIA AND FIBULA - 2 VIEW COMPARISON:  None. FINDINGS: Cortical margins of the tibia and fibular intact. There is no evidence of fracture or other focal bone lesions. Knee alignment is maintained. Mild lateral soft tissue edema. IMPRESSION: Soft tissue edema without acute fracture. Electronically Signed   By: Narda Rutherford M.D.   On: 04/02/2020 17:19   DG Ankle Complete Right  Result Date: 04/02/2020 CLINICAL  DATA:  Fall from porch after tripping over object. Right foot, ankle, and lower leg pain. EXAM: RIGHT ANKLE - COMPLETE 3+ VIEW COMPARISON:  None. FINDINGS: There is no evidence of fracture, dislocation, or joint effusion. There is no evidence of arthropathy or other focal bone abnormality. Soft tissues are unremarkable. IMPRESSION: Negative radiographs of the right ankle. Electronically Signed   By: Narda Rutherford M.D.   On: 04/02/2020 17:17   DG Foot Complete Right  Result Date: 04/02/2020 CLINICAL DATA:  Fall from porch after tripping over object. Right foot, ankle, and lower leg pain. EXAM: RIGHT FOOT COMPLETE - 3+ VIEW COMPARISON:  Foot radiograph 03/08/2017 FINDINGS: There is no evidence of fracture or dislocation. Minimal degenerative change of the medial midfoot. Minimal hallux valgus. Soft tissues are unremarkable. IMPRESSION: No fracture or subluxation of the right foot. Electronically Signed   By: Narda Rutherford M.D.   On: 04/02/2020 17:18    Procedures Procedures (including critical care time)  Medications Ordered in ED Medications  acetaminophen (TYLENOL) tablet 650 mg (650 mg Oral Given 04/02/20 1645)    ED Course  I have reviewed the triage vital signs and the nursing notes.  Pertinent labs & imaging results that were available during my care of the patient were reviewed by me and considered in my medical decision making (see chart for details).    MDM Rules/Calculators/A&P                          Kelli Brooks is a 39 y.o. female with no pertinent past  medical history that presents the emergency department today for right foot pain.  Plain films show no fractures or dislocations on foot, ankle, tibia-fibula.  Mild swelling in tib-fib, no fractures noticed.  No signs of compartment syndrome.  Pulses PT 2+ Doppler used.  Patient given Tylenol for pain. Upon reassessment patient states that pain feels better with Tylenol on board.  Patient able to ambulate with cam walker.  Did discuss importance of follow-up with [facility], spoke to her that this is a walk-in clinic she states that she will see them.  Discussed RICE protocol with patient, patient agreeable.  Since patient is having pain to midfoot, will keep patient in cam walker until she sees Ortho.  Patient ready for discharge at this time.  Discussed return precautions regarding compartment syndrome, patient expressed understanding.  Doubt need for further emergent work up at this time. I explained the diagnosis and have given explicit precautions to return to the ER including for any other new or worsening symptoms. The patient understands and accepts the medical plan as it's been dictated and I have answered their questions. Discharge instructions concerning home care and prescriptions have been given. The patient is STABLE and is discharged to home in good condition.   Final Clinical Impression(s) / ED Diagnoses Final diagnoses:  Leg pain, anterior, right  Injury of right foot, initial encounter    Rx / DC Orders ED Discharge Orders    None       Farrel Gordon, PA-C 04/02/20 1808    Virgina Norfolk, DO 04/02/20 2139

## 2020-04-02 NOTE — ED Triage Notes (Addendum)
Pt states she injured right foot 6 days ago-seen at [facility] 7/26-was advised "slight fracture"-wearing a cam walker-states she is still having pain to right foot and now having pain to tib/fib area-NAD-limping gait

## 2020-04-02 NOTE — Discharge Instructions (Addendum)
I need you to call [facility] to follow-up.  Continue to wear your boot for the next week, follow the below directions.  If you have any new or worsening concerning symptoms please come back to the emergency department.  These can include if your feet become numb or turn blue, your leg becomes cold, the pain becomes more severe.  Tests performed today include: An x-ray of the affected area - does NOT show any broken bones or dislocations.  Vital signs. See below for your results today.   Home care instructions: -- *PRICE in the first 24-48 hours after injury: Protect (with brace, splint, sling), if given by your provider Rest Ice- Do not apply ice pack directly to your skin, place towel or similar between your skin and ice/ice pack. Apply ice for 20 min, then remove for 40 min while awake Compression- Wear brace, elastic bandage, splint as directed by your provider Elevate affected extremity above the level of your heart when not walking around for the first 24-48 hours   Use Ibuprofen (Motrin/Advil) 600mg  every 6 hours as needed for pain (do not exceed max dose in 24 hours, 2400mg )  Follow-up instructions: Please follow-up with your primary care provider or the provided orthopedic physician (bone specialist) if you continue to have significant pain in 1 week. In this case you may have a more severe injury that requires further care.   Return instructions:  Please return if your toes or feet are numb or tingling, appear gray or blue, or you have severe pain (also elevate the leg and loosen splint or wrap if you were given one) Please return to the Emergency Department if you experience worsening symptoms.  Please return if you have any other emergent concerns. Additional Information:  Your vital signs today were: BP (!) 130/88 (BP Location: Left Arm)   Pulse 75   Temp 99.1 F (37.3 C) (Oral)   Resp 16   Ht 5\' 2"  (1.575 m)   Wt 88.5 kg   LMP 03/04/2020   SpO2 100%   BMI 35.67 kg/m    If your blood pressure (BP) was elevated above 135/85 this visit, please have this repeated by your doctor within one month. ---------------

## 2020-11-26 ENCOUNTER — Observation Stay (HOSPITAL_COMMUNITY)
Admission: EM | Admit: 2020-11-26 | Discharge: 2020-11-28 | Disposition: A | Discharge status: Home / Self Care | Payer: Self-pay | Attending: Family Medicine | Admitting: Family Medicine

## 2020-11-26 ENCOUNTER — Emergency Department (HOSPITAL_COMMUNITY): Payer: Self-pay

## 2020-11-26 ENCOUNTER — Encounter (HOSPITAL_COMMUNITY): Payer: Self-pay | Admitting: Internal Medicine

## 2020-11-26 ENCOUNTER — Observation Stay (HOSPITAL_COMMUNITY): Payer: Self-pay

## 2020-11-26 DIAGNOSIS — E876 Hypokalemia: Secondary | ICD-10-CM

## 2020-11-26 DIAGNOSIS — N289 Disorder of kidney and ureter, unspecified: Secondary | ICD-10-CM | POA: Insufficient documentation

## 2020-11-26 DIAGNOSIS — N9489 Other specified conditions associated with female genital organs and menstrual cycle: Secondary | ICD-10-CM

## 2020-11-26 DIAGNOSIS — R079 Chest pain, unspecified: Secondary | ICD-10-CM

## 2020-11-26 DIAGNOSIS — D75839 Thrombocytosis, unspecified: Secondary | ICD-10-CM

## 2020-11-26 DIAGNOSIS — Z20822 Contact with and (suspected) exposure to covid-19: Secondary | ICD-10-CM | POA: Insufficient documentation

## 2020-11-26 DIAGNOSIS — N920 Excessive and frequent menstruation with regular cycle: Secondary | ICD-10-CM | POA: Diagnosis present

## 2020-11-26 DIAGNOSIS — D649 Anemia, unspecified: Principal | ICD-10-CM

## 2020-11-26 DIAGNOSIS — R112 Nausea with vomiting, unspecified: Secondary | ICD-10-CM

## 2020-11-26 DIAGNOSIS — R55 Syncope and collapse: Secondary | ICD-10-CM

## 2020-11-26 LAB — CBC WITH DIFFERENTIAL/PLATELET
Abs Immature Granulocytes: 0 10*3/uL (ref 0.00–0.07)
Basophils Absolute: 0.2 10*3/uL — ABNORMAL HIGH (ref 0.0–0.1)
Basophils Relative: 3 %
Eosinophils Absolute: 0.1 10*3/uL (ref 0.0–0.5)
Eosinophils Relative: 2 %
HCT: 23.5 % — ABNORMAL LOW (ref 36.0–46.0)
Hemoglobin: 5.8 g/dL — CL (ref 12.0–15.0)
Lymphocytes Relative: 21 %
Lymphs Abs: 1.1 10*3/uL (ref 0.7–4.0)
MCH: 15.3 pg — ABNORMAL LOW (ref 26.0–34.0)
MCHC: 24.7 g/dL — ABNORMAL LOW (ref 30.0–36.0)
MCV: 61.8 fL — ABNORMAL LOW (ref 80.0–100.0)
Monocytes Absolute: 0 10*3/uL — ABNORMAL LOW (ref 0.1–1.0)
Monocytes Relative: 0 %
Neutro Abs: 3.8 10*3/uL (ref 1.7–7.7)
Neutrophils Relative %: 74 %
Platelets: 927 10*3/uL (ref 150–400)
RBC: 3.8 MIL/uL — ABNORMAL LOW (ref 3.87–5.11)
RDW: 23.4 % — ABNORMAL HIGH (ref 11.5–15.5)
WBC: 5.2 10*3/uL (ref 4.0–10.5)
nRBC: 0 % (ref 0.0–0.2)
nRBC: 0 /100 WBC

## 2020-11-26 LAB — TROPONIN I (HIGH SENSITIVITY): Troponin I (High Sensitivity): 11 ng/L (ref <18)

## 2020-11-26 LAB — COMPREHENSIVE METABOLIC PANEL
ALT: 16 U/L (ref 0–44)
AST: 24 U/L (ref 15–41)
Albumin: 3.8 g/dL (ref 3.5–5.0)
Alkaline Phosphatase: 51 U/L (ref 38–126)
Anion gap: 7 (ref 5–15)
BUN: 12 mg/dL (ref 6–20)
CO2: 25 mmol/L (ref 22–32)
Calcium: 9.1 mg/dL (ref 8.9–10.3)
Chloride: 107 mmol/L (ref 98–111)
Creatinine, Ser: 1.06 mg/dL — ABNORMAL HIGH (ref 0.44–1.00)
GFR, Estimated: >60 mL/min (ref >60)
Glucose, Bld: 103 mg/dL — ABNORMAL HIGH (ref 70–99)
Potassium: 3.4 mmol/L — ABNORMAL LOW (ref 3.5–5.1)
Sodium: 139 mmol/L (ref 135–145)
Total Bilirubin: 0.4 mg/dL (ref 0.3–1.2)
Total Protein: 7.1 g/dL (ref 6.5–8.1)

## 2020-11-26 LAB — SARS CORONAVIRUS 2 (TAT 6-24 HRS): SARS Coronavirus 2: NEGATIVE

## 2020-11-26 LAB — RETICULOCYTES
Immature Retic Fract: 24.9 % — ABNORMAL HIGH (ref 2.3–15.9)
RBC.: 3.56 MIL/uL — ABNORMAL LOW (ref 3.87–5.11)
Retic Count, Absolute: 58 10*3/uL (ref 19.0–186.0)
Retic Ct Pct: 1.6 % (ref 0.4–3.1)

## 2020-11-26 LAB — TYPE AND SCREEN
ABO/RH(D): O POS
Unit division: 0

## 2020-11-26 LAB — URINALYSIS, ROUTINE W REFLEX MICROSCOPIC
Bilirubin Urine: NEGATIVE
Glucose, UA: NEGATIVE mg/dL
Ketones, ur: NEGATIVE mg/dL
Nitrite: NEGATIVE
Protein, ur: NEGATIVE mg/dL
RBC / HPF: >50 RBC/hpf — ABNORMAL HIGH (ref 0–5)
Specific Gravity, Urine: 1.01 (ref 1.005–1.030)
pH: 6 (ref 5.0–8.0)

## 2020-11-26 LAB — FOLATE: Folate: 6.4 ng/mL (ref >5.9)

## 2020-11-26 LAB — IRON AND TIBC
Iron: 10 ug/dL — ABNORMAL LOW (ref 28–170)
Saturation Ratios: 2 % — ABNORMAL LOW (ref 10.4–31.8)
TIBC: 458 ug/dL — ABNORMAL HIGH (ref 250–450)
UIBC: 448 ug/dL

## 2020-11-26 LAB — VITAMIN B12: Vitamin B-12: 522 pg/mL (ref 180–914)

## 2020-11-26 LAB — LIPASE, BLOOD: Lipase: 44 U/L (ref 11–51)

## 2020-11-26 LAB — I-STAT BETA HCG BLOOD, ED (MC, WL, AP ONLY): I-stat hCG, quantitative: <5 m[IU]/mL (ref <5)

## 2020-11-26 LAB — ABO/RH: ABO/RH(D): O POS

## 2020-11-26 LAB — PREPARE RBC (CROSSMATCH)

## 2020-11-26 LAB — FERRITIN: Ferritin: 2 ng/mL — ABNORMAL LOW (ref 11–307)

## 2020-11-26 MED ORDER — ONDANSETRON HCL 4 MG/2ML IJ SOLN: 4.0000 mg | Freq: Four times a day (QID) | INTRAMUSCULAR | Status: DC | PRN

## 2020-11-26 MED ORDER — IOHEXOL 9 MG/ML PO SOLN
ORAL | Status: AC
  Administered 2020-11-26: 500 mL
  Filled 2020-11-26: qty 1000

## 2020-11-26 MED ORDER — DIPHENHYDRAMINE HCL 50 MG/ML IJ SOLN
25.0000 mg | Freq: Four times a day (QID) | INTRAMUSCULAR | Status: DC | PRN
  Administered 2020-11-26: 25 mg via INTRAVENOUS
  Filled 2020-11-26: qty 1

## 2020-11-26 MED ORDER — ACETAMINOPHEN 325 MG PO TABS
650.0000 mg | ORAL_TABLET | Freq: Four times a day (QID) | ORAL | Status: DC | PRN
  Administered 2020-11-27: 650 mg via ORAL
  Filled 2020-11-26: qty 2

## 2020-11-26 MED ORDER — PANTOPRAZOLE SODIUM 40 MG PO TBEC
40.0000 mg | DELAYED_RELEASE_TABLET | Freq: Every day | ORAL | Status: DC
  Administered 2020-11-26 – 2020-11-27 (×2): 40 mg via ORAL
  Filled 2020-11-26 (×2): qty 1

## 2020-11-26 MED ORDER — SODIUM CHLORIDE 0.9 % IV SOLN
250.0000 mg | Freq: Every day | INTRAVENOUS | Status: AC
  Administered 2020-11-27 (×2): 250 mg via INTRAVENOUS
  Filled 2020-11-26 (×2): qty 20

## 2020-11-26 MED ORDER — IOHEXOL 300 MG/ML  SOLN
100.0000 mL | Freq: Once | INTRAMUSCULAR | Status: AC | PRN
  Administered 2020-11-26: 100 mL via INTRAVENOUS

## 2020-11-26 MED ORDER — SODIUM CHLORIDE 0.9 % IV BOLUS
1000.0000 mL | Freq: Once | INTRAVENOUS | Status: AC
  Administered 2020-11-26: 1000 mL via INTRAVENOUS

## 2020-11-26 MED ORDER — ONDANSETRON HCL 4 MG PO TABS: 4.0000 mg | ORAL_TABLET | Freq: Four times a day (QID) | ORAL | Status: DC | PRN

## 2020-11-26 MED ORDER — ALBUTEROL SULFATE (2.5 MG/3ML) 0.083% IN NEBU: 2.5000 mg | INHALATION_SOLUTION | Freq: Four times a day (QID) | RESPIRATORY_TRACT | Status: DC | PRN

## 2020-11-26 MED ORDER — ONDANSETRON HCL 4 MG/2ML IJ SOLN
4.0000 mg | Freq: Once | INTRAMUSCULAR | Status: AC
  Administered 2020-11-26: 4 mg via INTRAVENOUS
  Filled 2020-11-26: qty 2

## 2020-11-26 MED ORDER — ACETAMINOPHEN 650 MG RE SUPP: 650.0000 mg | Freq: Four times a day (QID) | RECTAL | Status: DC | PRN

## 2020-11-26 MED ORDER — POTASSIUM CHLORIDE CRYS ER 20 MEQ PO TBCR
30.0000 meq | EXTENDED_RELEASE_TABLET | ORAL | Status: AC
  Administered 2020-11-26: 30 meq via ORAL
  Filled 2020-11-26: qty 1

## 2020-11-26 MED ORDER — SODIUM CHLORIDE 0.9 % IV SOLN
10.0000 mL/h | Freq: Once | INTRAVENOUS | Status: AC
Start: 2020-11-26 — End: 2020-11-26
  Administered 2020-11-26: 10 mL/h via INTRAVENOUS

## 2020-11-26 NOTE — ED Provider Notes (Signed)
[facility] EMERGENCY DEPARTMENT Provider Note   CSN: [id number] Arrival date & time: 11/26/20  0915     History Chief Complaint  Patient presents with   Nausea   Cough   Chest Pain    Kelli Brooks is a 40 y.o. female.  Patient arrives with chest pain, nausea.  Woke up this morning feeling nauseous and threw up a couple times.  Thought possibly secondary to food she ate yesterday.  She got to work and started to feel weak and lightheaded and dizzy felt like she is going to pass out.  She did not pass out.  She started to have some chest pain.  Having some center of the chest discomfort.  No abdominal pain.  Still feels slightly nauseous.  Has not had any diarrhea.  Denies any infectious symptoms.  Is not on any birth control pills, no recent surgery or travel.  The history is provided by the patient.  Near Syncope This is a new problem. The current episode started less than 1 hour ago. The problem has been resolved. Associated symptoms include chest pain. Pertinent negatives include no abdominal pain, no headaches and no shortness of breath. Nothing aggravates the symptoms. Nothing relieves the symptoms. She has tried nothing for the symptoms. The treatment provided no relief.       No past medical history on file.  There are no problems to display for this patient.   Past Surgical History:  Procedure Laterality Date   CESAREAN SECTION       OB History   No obstetric history on file.     No family history on file.  Social History   Tobacco Use   Smoking status: Never Smoker   Smokeless tobacco: Never Used  Vaping Use   Vaping Use: Never used  Substance Use Topics   Alcohol use: Yes    Comment: occ   Drug use: No    Home Medications Prior to Admission medications   Medication Sig Start Date End Date Taking? Authorizing Provider  ibuprofen (ADVIL,MOTRIN) 800 MG tablet Take 1 tablet (800 mg total) by mouth every 8 (eight) hours  as needed (start taking once you have finished the steroids). Patient not taking: No sig reported 03/08/17   Lawyer, Cristal Deer, PA-C  naproxen (NAPROSYN) 250 MG tablet Take 1 tablet (250 mg total) by mouth 2 (two) times daily with a meal. Patient not taking: No sig reported 10/23/15   Everlene Farrier, PA-C  traMADol (ULTRAM) 50 MG tablet Take 1 tablet (50 mg total) by mouth every 6 (six) hours as needed for severe pain. Patient not taking: No sig reported 03/08/17   Charlestine Night, PA-C    Allergies    Patient has no known allergies.  Review of Systems   Review of Systems  Constitutional: Negative for chills and fever.  HENT: Negative for ear pain and sore throat.   Eyes: Negative for pain and visual disturbance.  Respiratory: Negative for cough and shortness of breath.   Cardiovascular: Positive for chest pain and near-syncope. Negative for palpitations.  Gastrointestinal: Positive for nausea and vomiting. Negative for abdominal pain.  Genitourinary: Negative for dysuria and hematuria.  Musculoskeletal: Negative for arthralgias and back pain.  Skin: Negative for color change and rash.  Neurological: Negative for seizures, syncope and headaches.  All other systems reviewed and are negative.   Physical Exam Updated Vital Signs BP 120/72 (BP Location: Left Arm)    Pulse 93    Resp 18  Ht 5\' 2"  (1.575 m)    Wt 88.5 kg    LMP 11/26/2020    SpO2 99%    BMI 35.67 kg/m   Physical Exam Vitals and nursing note reviewed.  Constitutional:      General: She is not in acute distress.    Appearance: She is well-developed. She is not ill-appearing.  HENT:     Head: Normocephalic and atraumatic.  Eyes:     Conjunctiva/sclera: Conjunctivae normal.  Cardiovascular:     Rate and Rhythm: Normal rate and regular rhythm.     Pulses:          Radial pulses are 2+ on the right side and 2+ on the left side.     Heart sounds: Normal heart sounds. No murmur heard.   Pulmonary:     Effort:  Pulmonary effort is normal. No respiratory distress.     Breath sounds: Normal breath sounds. No decreased breath sounds or wheezing.  Abdominal:     Palpations: Abdomen is soft.     Tenderness: There is no abdominal tenderness.  Musculoskeletal:     Cervical back: Neck supple.     Right lower leg: No edema.     Left lower leg: No edema.  Skin:    General: Skin is warm and dry.     Capillary Refill: Capillary refill takes less than 2 seconds.  Neurological:     General: No focal deficit present.     Mental Status: She is alert.  Psychiatric:        Mood and Affect: Mood normal.     ED Results / Procedures / Treatments   Labs (all labs ordered are listed, but only abnormal results are displayed) Labs Reviewed  CBC WITH DIFFERENTIAL/PLATELET - Abnormal; Notable for the following components:      Result Value   RBC 3.80 (*)    Hemoglobin 5.8 (*)    HCT 23.5 (*)    MCV 61.8 (*)    MCH 15.3 (*)    MCHC 24.7 (*)    RDW 23.4 (*)    Platelets 927 (*)    Monocytes Absolute 0.0 (*)    Basophils Absolute 0.2 (*)    All other components within normal limits  COMPREHENSIVE METABOLIC PANEL - Abnormal; Notable for the following components:   Potassium 3.4 (*)    Glucose, Bld 103 (*)    Creatinine, Ser 1.06 (*)    All other components within normal limits  SARS CORONAVIRUS 2 (TAT 6-24 HRS)  LIPASE, BLOOD  VITAMIN B12  FOLATE  IRON AND TIBC  FERRITIN  RETICULOCYTES  I-STAT BETA HCG BLOOD, ED (MC, [facility], AP ONLY)  POC OCCULT BLOOD, ED  TYPE AND SCREEN  PREPARE RBC (CROSSMATCH)  TROPONIN I (HIGH SENSITIVITY)    EKG EKG Interpretation  Date/Time:  Thursday November 26 2020 09:16:44 EDT Ventricular Rate:  87 PR Interval:    QRS Duration: 86 QT Interval:  378 QTC Calculation: 455 R Axis:   3 Text Interpretation: Sinus rhythm Probable left atrial enlargement Low voltage, precordial leads Confirmed by [id number] [phone]) on 11/26/2020 9:22:06 AM   Radiology DG Chest Portable  1 View  Result Date: 11/26/2020 CLINICAL DATA:  Cough, mid chest pain EXAM: PORTABLE CHEST 1 VIEW COMPARISON:  Portable exam 1013 hours compared to 06/13/2016 FINDINGS: Borderline enlargement of cardiac silhouette. Mediastinal contours and pulmonary vascularity normal. Lungs clear. No infiltrate, pleural effusion or pneumothorax. Osseous structures unremarkable. IMPRESSION: No acute abnormalities. Electronically Signed  By: Ulyses Southward M.D.   On: 11/26/2020 10:34    Procedures .Critical Care Performed by: Virgina Norfolk, DO Authorized by: Virgina Norfolk, DO   Critical care provider statement:    Critical care time (minutes):  35   Critical care was necessary to treat or prevent imminent or life-threatening deterioration of the following conditions: symptomatic anemia.   Critical care was time spent personally by me on the following activities:  Blood draw for specimens, development of treatment plan with patient or surrogate, discussions with primary provider, evaluation of patient's response to treatment, examination of patient, obtaining history from patient or surrogate, ordering and performing treatments and interventions, ordering and review of laboratory studies, ordering and review of radiographic studies, pulse oximetry, re-evaluation of patient's condition and review of old charts   I assumed direction of critical care for this patient from another provider in my specialty: no       Medications Ordered in ED Medications  0.9 %  sodium chloride infusion (has no administration in time range)  sodium chloride 0.9 % bolus 1,000 mL (1,000 mLs Intravenous New Bag/Given 11/26/20 1008)  ondansetron (ZOFRAN) injection 4 mg (4 mg Intravenous Given 11/26/20 1008)    ED Course  I have reviewed the triage vital signs and the nursing notes.  Pertinent labs & imaging results that were available during my care of the patient were reviewed by me and considered in my medical decision making (see  chart for details).    MDM Rules/Calculators/A&P                          Kelli Brooks is here with chest pain, nausea, near syncope.  Normal vitals.  No fever.  EKG shows sinus rhythm.  No ischemic changes.  Patient woke up this morning with several episodes of emesis.  Suspect some food poisoning as she did eat some leftover food last night.  Not having any abdominal pain.  She got to work and started to feel little bit lightheaded and dizzy like she is in a pass out.  Developed some chest pain.  Still feels nauseous now.  Has no cardiac risk factors.  Heart score is 1.  We will check a troponin without ACS.  No PE risk factors and PERC negative and doubt PE.  Overall suspect possibly food poisoning and will give IV hydration and IV Zofran and check electrolytes and reevaluate.  No abdominal pain and lower suspicion for cholecystitis or pancreatitis.  Hemoglobin is 5.8.  Platelets also elevated to 927.  Suspect symptomatic anemia because of her symptoms today.  MCV is 61 and she does have still some heavy menstrual cycles and suspected iron deficiency anemia.  Stool is grossly brown on exam.  We will transfuse her 2 units of packed red blood cells and have her admitted to medicine for further work-up.  Hemodynamically stable otherwise.  This chart was dictated using voice recognition software.  Despite best efforts to proofread,  errors can occur which can change the documentation meaning.    Final Clinical Impression(s) / ED Diagnoses Final diagnoses:  Symptomatic anemia  Near syncope    Rx / DC Orders ED Discharge Orders    None       Virgina Norfolk, DO 11/26/20 1059

## 2020-11-26 NOTE — Care Management (Signed)
Consult for PCP. Scheduled an appointment at [facility] , first available May 6, 2:30 pm , info placed on AVS.   Changed to [facility] pharmacy. Once scripts determined will se if MATCH covers.   Ronny Flurry RN

## 2020-11-26 NOTE — H&P (Signed)
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 female with past medical history significant for menorrhagia who presents with complaints of dizziness and chest pain.  When she woke up this morning she felt nauseous and vomited couple times prior to going to work.  At work she had gone to use the bathroom and while walking back felt lightheaded as though she could pass out and started to have substernal stabbing chest pain.  Pain radiated down her left arm which she told her coworker to call 911.  She has been dealing with a lot of stressors as she reports that her is recently passed away couple weeks ago with a history of 47 from a heart attack.  Family history is also significant for father also having heart attack at the age of 61.  Patient does not have a primary care provider due to lack of insurance.  Reports that she has been eating ice for quite some time and knows she is likely anemic.  Her  menstrual cycles are usually heavy and can last up to 2 weeks.  She is currently on her menstrual period now.  She does not take any vitamin supplements.  Associated symptoms include heartburn, intermittent cough, intermittent crampy abdominal pain even when she is not on her menstrual cycle for quite some time.  She will intermittently take Aleve 1-2/wk and/or Tylenol for symptoms.  Denies dysuria, urinary frequency, recent episodes of diarrhea, or blood in stool.  Patient smokes black n milds occasionally and drinks alcohol socially.  Denies any illicit drug use.  Prior to EMS arrival patient had chewed aspirin with subsequent improvement in chest pain symptoms.  ED Course: Upon admission into the emergency  department patient was seen to be heart rates elevated up to 147, respirations 9-21, and all other vital signs maintained.  Labs significant for hemoglobin 5.8, MCV 61.8, MCH 15.3,   platelet count 927, potassium 3.4, BUN 12, and creatinine 1.06.  Chest x-ray showed no acute abnormalities.  COVID-19 screening was pending.  Patient had been ordered to be transfused 2 units of packed red blood cells.  Review of Systems  Constitutional: Positive for malaise/fatigue. Negative for fever.  HENT: Negative for congestion and nosebleeds.   Eyes: Negative for photophobia and pain.  Respiratory: Positive for cough and shortness of breath.   Cardiovascular: Positive for chest pain. Negative for leg swelling.  Gastrointestinal: Positive for abdominal pain, heartburn, nausea and vomiting. Negative for diarrhea.  Genitourinary: Negative for dysuria and frequency.  Musculoskeletal: Negative for falls.  Skin: Positive for itching. Negative for rash.  Neurological: Positive for dizziness. Negative for loss of consciousness.  Psychiatric/Behavioral: Positive for substance abuse. Negative for memory loss.    No past medical history on file.  Past Surgical History:  Procedure Laterality Date  . CESAREAN SECTION       reports that she has never smoked. She has never used smokeless tobacco. She reports current alcohol use. She reports that she does not use drugs.  No Known Allergies  Family History  Problem Relation Age of Onset  . Heart attack Brother 47  . Heart attack Father 89    Prior to Admission medications   Medication Sig Start  Date End Date Taking? Authorizing Provider  ibuprofen (ADVIL,MOTRIN) 800 MG tablet Take 1 tablet (800 mg total) by mouth every 8 (eight) hours as needed (start taking once you have finished the steroids). Patient not taking: No sig reported 03/08/17   Lawyer, Cristal Deer, PA-C  naproxen (NAPROSYN) 250 MG tablet Take 1 tablet (250 mg total) by mouth 2 (two) times daily  with a meal. Patient not taking: No sig reported 10/23/15   Everlene Farrier, PA-C  traMADol (ULTRAM) 50 MG tablet Take 1 tablet (50 mg total) by mouth every 6 (six) hours as needed for severe pain. Patient not taking: No sig reported 03/08/17   Charlestine Night, PA-C    Physical Exam:  Constitutional: Female currently NAD, calm, comfortable Vitals:   11/26/20 0919 11/26/20 0922 11/26/20 1145  BP: 120/72  138/76  Pulse: 93  (!) 147  Resp: 18  16  SpO2: 99%  99%  Weight:  88.5 kg   Height:  5\' 2"  (1.575 m)    Eyes: PERRL, lids and conjunctivae normal ENMT: Mucous membranes are moist. Posterior pharynx clear of any exudate or lesions.  Neck: normal, supple, no masses, no thyromegaly Respiratory: clear to auscultation bilaterally, no wheezing, no crackles. Normal respiratory effort. No accessory muscle use.  Cardiovascular: Mildly tachycardic, no murmurs / rubs / gallops. No extremity edema. 2+ pedal pulses. No carotid bruits.  Abdomen: Mild tenderness palpation of the right quadrant.  Bowel sounds present. Musculoskeletal: no clubbing / cyanosis. No joint deformity upper and lower extremities. Good ROM, no contractures. Normal muscle tone.  Skin: Pallor present. Neurologic: CN 2-12 grossly intact. Sensation intact, DTR normal. Strength 5/5 in all 4.  Psychiatric: Normal judgment and insight. Alert and oriented x 3. Normal mood.     Labs on Admission: I have personally reviewed following labs and imaging studies  CBC: Recent Labs  Lab 11/26/20 0942  WBC 5.2  NEUTROABS 3.8  HGB 5.8*  HCT 23.5*  MCV 61.8*  PLT 927*   Basic Metabolic Panel: Recent Labs  Lab 11/26/20 0942  NA 139  K 3.4*  CL 107  CO2 25  GLUCOSE 103*  BUN 12  CREATININE 1.06*  CALCIUM 9.1   GFR: Estimated Creatinine Clearance: 72.9 mL/min (A) (by C-G formula based on SCr of 1.06 mg/dL (H)). Liver Function Tests: Recent Labs  Lab 11/26/20 0942  AST 24  ALT 16  ALKPHOS 51  BILITOT 0.4  PROT 7.1   ALBUMIN 3.8   Recent Labs  Lab 11/26/20 0942  LIPASE 44   No results for input(s): AMMONIA in the last 168 hours. Coagulation Profile: No results for input(s): INR, PROTIME in the last 168 hours. Cardiac Enzymes: No results for input(s): CKTOTAL, CKMB, CKMBINDEX, TROPONINI in the last 168 hours. BNP (last 3 results) No results for input(s): PROBNP in the last 8760 hours. HbA1C: No results for input(s): HGBA1C in the last 72 hours. CBG: No results for input(s): GLUCAP in the last 168 hours. Lipid Profile: No results for input(s): CHOL, HDL, LDLCALC, TRIG, CHOLHDL, LDLDIRECT in the last 72 hours. Thyroid Function Tests: No results for input(s): TSH, T4TOTAL, FREET4, T3FREE, THYROIDAB in the last 72 hours. Anemia Panel: Recent Labs    11/26/20 1041  RETICCTPCT 1.6   Urine analysis:    Component Value Date/Time   COLORURINE YELLOW 08/06/2011 0621   APPEARANCEUR CLOUDY (A) 08/06/2011 0621   LABSPEC 1.023 08/06/2011 0621   PHURINE 5.5 08/06/2011 0621   GLUCOSEU NEGATIVE 08/06/2011 0621   HGBUR NEGATIVE  08/06/2011 0621   BILIRUBINUR NEGATIVE 08/06/2011 0621   KETONESUR NEGATIVE 08/06/2011 0621   PROTEINUR NEGATIVE 08/06/2011 0621   UROBILINOGEN 0.2 08/06/2011 0621   NITRITE NEGATIVE 08/06/2011 0621   LEUKOCYTESUR NEGATIVE 08/06/2011 0621   Sepsis Labs: No results found for this or any previous visit (from the past 240 hour(s)).   Radiological Exams on Admission: DG Chest Portable 1 View  Result Date: 11/26/2020 CLINICAL DATA:  Cough, mid chest pain EXAM: PORTABLE CHEST 1 VIEW COMPARISON:  Portable exam 1013 hours compared to 06/13/2016 FINDINGS: Borderline enlargement of cardiac silhouette. Mediastinal contours and pulmonary vascularity normal. Lungs clear. No infiltrate, pleural effusion or pneumothorax. Osseous structures unremarkable. IMPRESSION: No acute abnormalities. Electronically Signed   By: Ulyses Southward M.D.   On: 11/26/2020 10:34    EKG: Independently  reviewed.  Sinus rhythm 87 bpm with left atrial abnormality  Assessment/Plan Near syncope secondary to iron deficiency anemia: Acute.  Patient presents after having a near syncopal episode while at work.  She reports having pica.  Hemoglobin found to be 5.8 with MCV 61, MCH 15.3, and RDW 23.4 all suggestive of iron deficiency anemia.  Anemia panel was significant for iron 10, TIBC 458, and ferritin 2.  Stool guaiacs were reported to be negative.  Patient had been typed and screened and ordered 2 units of packed red blood cells.  -Admit to a medical telemetry bed -Continue with transfusion of 2 units of packed red blood cells. -Ferric gluconate IV 250mg  x2 -Serial monitoring of H&H posttransfusion  Chest pain: Acute.  Patient reported having stabbing substernal chest pain with radiation down her left arm.  She had taken laspirin with improvement in pain symptoms.  Initial high-sensitivity troponin of life and an EKG without significant ischemic changes family history however is significant for heart attack in her brother at age 62 and father at the age of 28.  Chest x-ray did note a borderline enlarged cardiac silhouette, but suspect this is most likely secondary to anemia. -Check echocardiogram  Nausea/ Vomiting and abdominal pain: Patient reports nausea and vomiting this morning, but appears to have been chronically having this midline abdominal pain for quite some time. -Check urinalysis and CT scan of the abdomen and pelvis  Menorrhagia: Patient reports regular menstrual cycles monthly, but are usually heavy and can last up to 2 weeks.  She is currently on her menstrual period currently at this time. -Patient would benefit from referral to [facility] clinic in the outpatient setting  Hypokalemia: Acute.  Potassium is mildly low at 3.4.  Suspect secondary to nausea and vomiting -Give 30 mEq of potassium chloride p.o. -Continue to monitor and replace as needed  Thrombocytosis: Acute.   Platelet count 927.  Suspect secondary patient's severe iron deficiency. -Continue to monitor  Renal insufficiency: Acute.  Creatinine 1.06 on admission.  Patient last known baseline creatinine was 0.97 back in 2017.  Suspect secondary to severe anemia. -Continue to monitor kidney function  DVT prophylaxis: SCDs Code Status: Full Family Communication: Patient's parents updated at bedside Disposition Plan: Hopefully likely discharge home if hemoglobin stays stable Consults called: None Admission status: Observation  2018 MD [facility]   If 7PM-7AM, please contact night-coverage   11/26/2020, 12:07 PM

## 2020-11-26 NOTE — ED Triage Notes (Signed)
Pt. Bib from home by ems. She reported she had N/V, cough, and chest pain upon palpation. Pt. Stated under a lot of stress due to death. EMS completed a 12 lead EKG with no significant findings. Pt. Has no LOC, N/V upon arriving. Vitals BP 158/87 P 90 sPO2 100% pt took 325 aspirin.

## 2020-11-27 ENCOUNTER — Observation Stay (HOSPITAL_BASED_OUTPATIENT_CLINIC_OR_DEPARTMENT_OTHER): Payer: Self-pay

## 2020-11-27 ENCOUNTER — Observation Stay (HOSPITAL_COMMUNITY): Payer: Self-pay

## 2020-11-27 DIAGNOSIS — I340 Nonrheumatic mitral (valve) insufficiency: Secondary | ICD-10-CM

## 2020-11-27 DIAGNOSIS — R079 Chest pain, unspecified: Secondary | ICD-10-CM

## 2020-11-27 LAB — LIPID PANEL
Cholesterol: 158 mg/dL (ref 0–200)
HDL: 49 mg/dL (ref >40)
LDL Cholesterol: 94 mg/dL (ref 0–99)
Total CHOL/HDL Ratio: 3.2 RATIO
Triglycerides: 75 mg/dL (ref <150)
VLDL: 15 mg/dL (ref 0–40)

## 2020-11-27 LAB — HIV ANTIBODY (ROUTINE TESTING W REFLEX): HIV Screen 4th Generation wRfx: NONREACTIVE

## 2020-11-27 LAB — BASIC METABOLIC PANEL
Anion gap: 7 (ref 5–15)
BUN: 8 mg/dL (ref 6–20)
CO2: 22 mmol/L (ref 22–32)
Calcium: 8.5 mg/dL — ABNORMAL LOW (ref 8.9–10.3)
Chloride: 109 mmol/L (ref 98–111)
Creatinine, Ser: 1.08 mg/dL — ABNORMAL HIGH (ref 0.44–1.00)
GFR, Estimated: >60 mL/min (ref >60)
Glucose, Bld: 93 mg/dL (ref 70–99)
Potassium: 3.5 mmol/L (ref 3.5–5.1)
Sodium: 138 mmol/L (ref 135–145)

## 2020-11-27 LAB — CBC
HCT: 26.6 % — ABNORMAL LOW (ref 36.0–46.0)
Hemoglobin: 7.9 g/dL — ABNORMAL LOW (ref 12.0–15.0)
MCH: 19.8 pg — ABNORMAL LOW (ref 26.0–34.0)
MCHC: 29.7 g/dL — ABNORMAL LOW (ref 30.0–36.0)
MCV: 66.7 fL — ABNORMAL LOW (ref 80.0–100.0)
Platelets: 837 10*3/uL — ABNORMAL HIGH (ref 150–400)
RBC: 3.99 MIL/uL (ref 3.87–5.11)
RDW: 28.5 % — ABNORMAL HIGH (ref 11.5–15.5)
WBC: 6.8 10*3/uL (ref 4.0–10.5)
nRBC: 0 % (ref 0.0–0.2)

## 2020-11-27 LAB — BPAM RBC
Blood Product Expiration Date: 202204242359
Blood Product Expiration Date: 202204242359
ISSUE DATE / TIME: 202203241425
ISSUE DATE / TIME: 202203242013
Unit Type and Rh: 5100
Unit Type and Rh: 5100

## 2020-11-27 LAB — TYPE AND SCREEN
Antibody Screen: NEGATIVE
Unit division: 0

## 2020-11-27 LAB — ECHOCARDIOGRAM COMPLETE
Area-P 1/2: 3.54 cm2
Calc EF: 57.3 %
Height: 62 in
S' Lateral: 3.5 cm
Single Plane A2C EF: 58.6 %
Single Plane A4C EF: 56.6 %
Weight: 3120 oz

## 2020-11-27 LAB — HEMOGLOBIN AND HEMATOCRIT, BLOOD
HCT: 28.9 % — ABNORMAL LOW (ref 36.0–46.0)
Hemoglobin: 8.6 g/dL — ABNORMAL LOW (ref 12.0–15.0)

## 2020-11-27 LAB — TROPONIN I (HIGH SENSITIVITY)
Troponin I (High Sensitivity): 12 ng/L (ref <18)
Troponin I (High Sensitivity): 14 ng/L (ref <18)

## 2020-11-27 LAB — PATHOLOGIST SMEAR REVIEW

## 2020-11-27 NOTE — Progress Notes (Signed)
  Echocardiogram 2D Echocardiogram has been performed.  Kelli Brooks 11/27/2020, 11:32 AM

## 2020-11-27 NOTE — Progress Notes (Signed)
PROGRESS NOTE    Kelli Brooks  ZOX:[id number] DOB: 1981/06/21 DOA: 11/26/2020 PCP: Pcp, No    Brief Narrative:  This 40 years old female with PMH significant for menorrhagia who presents in the ED with complaints of dizziness and chest pain.  Patient reports after she woke up in the morning she felt nauseous and vomited twice prior to going to work.  At work she reported going to the bathroom and while walking back she felt lightheaded and had felt like about to pass out and had substernal chest pain.  She described chest pain as tightness radiating towards her left arm.  She reports been dealing with a lot of stressors.  Her family history is significant for father having heart attack at the age of 29.  She reports her menstrual cycle has been heavy and can last up to 2 weeks.  She was found to have hemoglobin of 5.8, chest x-ray shows no abnormalities.  She has received 2 units of packed red blood cells.  EKG unremarkable.  Troponin x2 - CT abdomen pelvis shows mass in the endometrium.  Pelvic ultrasound completed.  Assessment & Plan:   Principal Problem:   Symptomatic anemia Active Problems:   Menorrhagia   Nausea and vomiting   Chest pain   Hypokalemia   Thrombocytosis   Renal insufficiency   Near syncope secondary to iron deficiency anemia: Acute.   Patient presents after having a near syncopal episode while at work.   Hemoglobin found to be 5.8 with MCV 61, MCH 15.3, and RDW 23.4  All suggestive of iron deficiency anemia.  Anemia panel was significant for iron 10, TIBC 458, and ferritin 2.   Stool guaiacs were reported to be negative.  S/p 2 units of PRBC, hemoglobin 7.9. Ferric gluconate IV  x2 Monitor H&H.  Chest pain: Acute > Resolved. Patient reports having stabbing substernal chest pain with radiation down her left arm.   She had taken laspirin with improvement in pain symptoms.   Initial high-sensitivity troponin  and an EKG without significant ischemic  changes,  Continue to trend troponins. Family history however is significant for heart attack in her brother at age 62 and father at the age of 58.   Chest x-ray did note a borderline enlarged cardiac silhouette, but suspect this is most likely secondary to anemia. Echocardiogram : LVEF 60 to 65%, no wall motion abnormalities.  Nausea/ Vomiting and abdominal pain:  Patient reports nausea and vomiting this morning, now improved  CT abdomen and pelvis shows mass in the endometrium otherwise unremarkable. Obtain pelvic ultrasound.  Consider OB/GYN consult.  Menorrhagia: Patient reports regular menstrual cycles monthly, but are usually heavy and can last up to 2 weeks.   She is currently on her menstrual period currently at this time. -Patient would benefit from referral to [facility] clinic in the outpatient setting  Hypokalemia: > Resolved  Thrombocytosis: Platelet count 927.  Suspect secondary patient's severe iron deficiency. -Continue to monitor  Renal insufficiency: Acute.  Creatinine 1.06 on admission.   Patient last known baseline creatinine was 0.97 back in 2017.   Suspect secondary to severe anemia. -Continue to monitor kidney function   DVT prophylaxis: SCDS Code Status: Full code Family Communication: No family at bedside Disposition Plan:  Status is: Observation  The patient remains OBS appropriate and will d/c before 2 midnights.  Dispo: The patient is from: Home              Anticipated d/c is to: Home  Patient currently is not medically stable to d/c.   Difficult to place patient No   Consultants:   OB/GYN  Procedures: CT abdomen, pelvic ultrasound, echocardiogram Antimicrobials:  Anti-infectives (From admission, onward)   None      Subjective: Patient was seen and examined at bedside.  Overnight events noted.  Patient reports chest pain has resolved.   Patient feels better still has persistent heavy  menstrual..  Objective: Vitals:   11/26/20 2342 11/27/20 0208 11/27/20 0458 11/27/20 1253  BP: 120/75 117/71 127/79 120/82  Pulse: 71 75 69 69  Resp: Temp: 98.1 F (36.7 C) (!) 97.2 F (36.2 C) 98.5 F (36.9 C) 98.3 F (36.8 C)  TempSrc:    Oral  SpO2:  100% 100% 95%  Weight:      Height:        Intake/Output Summary (Last 24 hours) at 11/27/2020 1525 Last data filed at 11/27/2020 1406 Gross per 24 hour  Intake 1079 ml  Output -  Net 1079 ml   Filed Weights   11/26/20 0922  Weight: 88.5 kg    Examination:  General exam: Appears calm and comfortable , not in any acute distress. Respiratory system: Clear to auscultation. Respiratory effort normal. Cardiovascular system: S1 & S2 heard, RRR. No JVD, murmurs, rubs, gallops or clicks. No pedal edema. Gastrointestinal system: Abdomen is nondistended, soft and nontender. No organomegaly or masses felt.  Normal bowel sounds heard. Central nervous system: Alert and oriented. No focal neurological deficits. Extremities: Symmetric 5 x 5 power.  No edema, no cyanosis, no clubbing.   Skin: No rashes, lesions or ulcers Psychiatry: Judgement and insight appear normal. Mood & affect appropriate.     Data Reviewed: I have personally reviewed following labs and imaging studies  CBC: Recent Labs  Lab 11/26/20 0942 11/27/20 0131  WBC 5.2 6.8  NEUTROABS 3.8  --   HGB 5.8* 7.9*  HCT 23.5* 26.6*  MCV 61.8* 66.7*  PLT 927* 837*   Basic Metabolic Panel: Recent Labs  Lab 11/26/20 0942 11/27/20 0131  NA 139 138  K 3.4* 3.5  CL 107 109  CO2 25 22  GLUCOSE 103* 93  BUN 12 8  CREATININE 1.06* 1.08*  CALCIUM 9.1 8.5*   GFR: Estimated Creatinine Clearance: 71.6 mL/min (A) (by C-G formula based on SCr of 1.08 mg/dL (H)). Liver Function Tests: Recent Labs  Lab 11/26/20 0942  AST 24  ALT 16  ALKPHOS 51  BILITOT 0.4  PROT 7.1  ALBUMIN 3.8   Recent Labs  Lab 11/26/20 0942  LIPASE 44   No results for  input(s): AMMONIA in the last 168 hours. Coagulation Profile: No results for input(s): INR, PROTIME in the last 168 hours. Cardiac Enzymes: No results for input(s): CKTOTAL, CKMB, CKMBINDEX, TROPONINI in the last 168 hours. BNP (last 3 results) No results for input(s): PROBNP in the last 8760 hours. HbA1C: No results for input(s): HGBA1C in the last 72 hours. CBG: No results for input(s): GLUCAP in the last 168 hours. Lipid Profile: Recent Labs    11/27/20 0131  CHOL 158  HDL 49  LDLCALC 94  TRIG 75  CHOLHDL 3.2   Thyroid Function Tests: No results for input(s): TSH, T4TOTAL, FREET4, T3FREE, THYROIDAB in the last 72 hours. Anemia Panel: Recent Labs    11/26/20 1041  VITAMINB12 522  FOLATE 6.4  FERRITIN 2*  TIBC 458*  IRON 10*  RETICCTPCT 1.6   Sepsis Labs: No results for input(s): PROCALCITON,  LATICACIDVEN in the last 168 hours.  Recent Results (from the past 240 hour(s))  SARS CORONAVIRUS 2 (TAT 6-24 HRS) Nasopharyngeal Nasopharyngeal Swab     Status: None   Collection Time: 11/26/20 10:42 AM   Specimen: Nasopharyngeal Swab  Result Value Ref Range Status   SARS Coronavirus 2 NEGATIVE NEGATIVE Final    Comment: (NOTE) SARS-CoV-2 target nucleic acids are NOT DETECTED.  The SARS-CoV-2 RNA is generally detectable in upper and lower respiratory specimens during the acute phase of infection. Negative results do not preclude SARS-CoV-2 infection, do not rule out co-infections with other pathogens, and should not be used as the sole basis for treatment or other patient management decisions. Negative results must be combined with clinical observations, patient history, and epidemiological information. The expected result is Negative.  Fact Sheet for Patients: HairSlick.no  Fact Sheet for Healthcare Providers: quierodirigir.com  This test is not yet approved or cleared by the [place] FDA and  has been  authorized for detection and/or diagnosis of SARS-CoV-2 by FDA under an Emergency Use Authorization (EUA). This EUA will remain  in effect (meaning this test can be used) for the duration of the COVID-19 declaration under Se ction 564(b)(1) of the Act, 21 U.S.C. section 360bbb-3(b)(1), unless the authorization is terminated or revoked sooner.  Performed at [facility], [address] [place], [place] [address]    Radiology Studies: CT ABDOMEN PELVIS W CONTRAST  Result Date: 11/26/2020 CLINICAL DATA:  Abdominal pain and nausea EXAM: CT ABDOMEN AND PELVIS WITH CONTRAST TECHNIQUE: Multidetector CT imaging of the abdomen and pelvis was performed using the standard protocol following bolus administration of intravenous contrast. Oral contrast was also administered. CONTRAST:  OMNIPAQUE IOHEXOL 300 MG/ML  SOLN COMPARISON:  None. FINDINGS: Lower chest: There is mild bibasilar atelectasis. There is no edema or airspace opacity. Hepatobiliary: No focal liver lesions are evident. Gallbladder wall is not appreciably thickened. There is no biliary duct dilatation. Pancreas: There is no pancreatic mass or inflammatory focus. Spleen: No splenic lesions are evident. Small splenules inferior to the spleen noted. Adrenals/Urinary Tract: Adrenals bilaterally appear normal. There is no evident renal mass or hydronephrosis on either side. There is no renal or ureteral calculus on either side. Urinary bladder is midline with wall thickness within normal limits. Stomach/Bowel: There is no appreciable bowel wall or mesenteric thickening. There is no demonstrable bowel obstruction. The terminal ileum appears unremarkable. No periappendiceal region inflammatory change. No free air or portal venous air. Vascular/Lymphatic: No abdominal aortic aneurysm. No arterial vascular lesions are evident. Major venous structures appear patent. There is no evident adenopathy in the abdomen or pelvis. Reproductive: The uterus  is anteverted. The endometrium is thickened with an apparent mass within the endometrium measuring 2.7 x 2.7 x 2.8 cm. Fluid tracks throughout the cervix as well. No adnexal masses are evident. Other: No appreciable ascites or abscess in the abdomen or pelvis. There is a small umbilical hernia containing fat but no bowel. Musculoskeletal: No blastic or lytic bone lesions. Small bone island in the T12 vertebral body. No intramuscular lesions. IMPRESSION: 1. Thickened endometrium with an apparent mass within the endometrium measuring 2.7 x 2.7 x 2.8 cm. There is also fluid throughout the cervix. Pelvic ultrasound advised to further evaluate. 2. No evident bowel wall thickening or bowel obstruction. No abscess in the abdomen or pelvis. Appendix appears normal. 3. No evident renal or ureteral calculus. No hydronephrosis. Urinary bladder wall thickness is within normal limits. These results will  be called to the ordering clinician or representative by the Radiologist Assistant, and communication documented in the PACS or [employer]. Electronically Signed   By: Bretta Bang III M.D.   On: 11/26/2020 19:26   DG Chest Portable 1 View  Result Date: 11/26/2020 CLINICAL DATA:  Cough, mid chest pain EXAM: PORTABLE CHEST 1 VIEW COMPARISON:  Portable exam 1013 hours compared to 06/13/2016 FINDINGS: Borderline enlargement of cardiac silhouette. Mediastinal contours and pulmonary vascularity normal. Lungs clear. No infiltrate, pleural effusion or pneumothorax. Osseous structures unremarkable. IMPRESSION: No acute abnormalities. Electronically Signed   By: Ulyses Southward M.D.   On: 11/26/2020 10:34   ECHOCARDIOGRAM COMPLETE  Result Date: 11/27/2020    ECHOCARDIOGRAM REPORT   Patient Name:   SHATERICA MCCLATCHY Date of Exam: 11/27/2020 Medical Rec #:  [id number]         Height:       62.0 in Accession #:    [id number]        Weight:       195.0 lb Date of Birth:  04/09/1981         BSA:          1.891 m Patient Age:    39  years          BP:           127/79 mmHg Patient Gender: F                 HR:           62 bpm. Exam Location:  Inpatient Procedure: 2D Echo, Cardiac Doppler and Color Doppler Indications:    Chest Pain R07.9  History:        Patient has no prior history of Echocardiogram examinations.                 Risk Factors:Non-Smoker.  Sonographer:    Renella Cunas RDCS Referring Phys: [id number] RONDELL A SMITH IMPRESSIONS  1. Left ventricular ejection fraction, by estimation, is 60 to 65%. The left ventricle has normal function. The left ventricle has no regional wall motion abnormalities. Left ventricular diastolic parameters were normal.  2. Right ventricular systolic function is normal. The right ventricular size is normal. There is normal pulmonary artery systolic pressure. The estimated right ventricular systolic pressure is 25.5 mmHg.  3. Left atrial size was mildly dilated.  4. The mitral valve is normal in structure. Mild mitral valve regurgitation. No evidence of mitral stenosis.  5. The aortic valve is normal in structure. Aortic valve regurgitation is not visualized. No aortic stenosis is present.  6. The inferior vena cava is normal in size with greater than 50% respiratory variability, suggesting right atrial pressure of 3 mmHg. FINDINGS  Left Ventricle: Left ventricular ejection fraction, by estimation, is 60 to 65%. The left ventricle has normal function. The left ventricle has no regional wall motion abnormalities. The left ventricular internal cavity size was normal in size. There is  no left ventricular hypertrophy. Left ventricular diastolic parameters were normal. Normal left ventricular filling pressure. Right Ventricle: The right ventricular size is normal. No increase in right ventricular wall thickness. Right ventricular systolic function is normal. There is normal pulmonary artery systolic pressure. The tricuspid regurgitant velocity is 2.37 m/s, and  with an assumed right atrial pressure of 3 mmHg,  the estimated right ventricular systolic pressure is 25.5 mmHg. Left Atrium: Left atrial size was mildly dilated. Right Atrium: Right atrial size was normal in size. Pericardium: There is no  evidence of pericardial effusion. Mitral Valve: The mitral valve is normal in structure. Mild mitral valve regurgitation. No evidence of mitral valve stenosis. Tricuspid Valve: The tricuspid valve is normal in structure. Tricuspid valve regurgitation is trivial. No evidence of tricuspid stenosis. Aortic Valve: The aortic valve is normal in structure. Aortic valve regurgitation is not visualized. No aortic stenosis is present. Pulmonic Valve: The pulmonic valve was normal in structure. Pulmonic valve regurgitation is not visualized. No evidence of pulmonic stenosis. Aorta: The aortic root is normal in size and structure. Venous: The inferior vena cava is normal in size with greater than 50% respiratory variability, suggesting right atrial pressure of 3 mmHg. IAS/Shunts: No atrial level shunt detected by color flow Doppler.  LEFT VENTRICLE PLAX 2D LVIDd:         5.30 cm      Diastology LVIDs:         3.50 cm      LV e' medial:    7.87 cm/s LV PW:         0.90 cm      LV E/e' medial:  8.6 LV IVS:        0.90 cm      LV e' lateral:   10.70 cm/s LVOT diam:     2.10 cm      LV E/e' lateral: 6.3 LV SV:         67 LV SV Index:   35 LVOT Area:     3.46 cm  LV Volumes (MOD) LV vol d, MOD A2C: 135.0 ml LV vol d, MOD A4C: 116.0 ml LV vol s, MOD A2C: 55.9 ml LV vol s, MOD A4C: 50.3 ml LV SV MOD A2C:     79.1 ml LV SV MOD A4C:     116.0 ml LV SV MOD BP:      72.7 ml RIGHT VENTRICLE RV S prime:     12.50 cm/s TAPSE (M-mode): 1.7 cm LEFT ATRIUM             Index       RIGHT ATRIUM           Index LA diam:        3.80 cm 2.01 cm/m  RA Area:     18.10 cm LA Vol (A2C):   54.2 ml 28.66 ml/m RA Volume:   52.10 ml  27.55 ml/m LA Vol (A4C):   48.7 ml 25.75 ml/m LA Biplane Vol: 53.3 ml 28.18 ml/m  AORTIC VALVE LVOT Vmax:   91.60 cm/s LVOT Vmean:   63.400 cm/s LVOT VTI:    0.193 m  AORTA Ao Root diam: 3.10 cm Ao Asc diam:  3.30 cm MITRAL VALVE               TRICUSPID VALVE MV Area (PHT): 3.54 cm    TR Peak grad:   22.5 mmHg MV Decel Time: 214 msec    TR Vmax:        237.00 cm/s MV E velocity: 67.70 cm/s MV A velocity: 48.00 cm/s  SHUNTS MV E/A ratio:  1.41        Systemic VTI:  0.19 m                            Systemic Diam: 2.10 cm Rachelle HoraMihai Croitoru MD Electronically signed by Thurmon FairMihai Croitoru MD Signature Date/Time: 11/27/2020/2:00:24 PM    Final    US PELVIC COMPLETE WITH TRANSVAGINAL  Result Date:  11/27/2020 CLINICAL DATA:  Evaluate mass in endometrium. EXAM: TRANSABDOMINAL AND TRANSVAGINAL ULTRASOUND OF PELVIS TECHNIQUE: Both transabdominal and transvaginal ultrasound examinations of the pelvis were performed. Transabdominal technique was performed for global imaging of the pelvis including uterus, ovaries, adnexal regions, and pelvic cul-de-sac. It was necessary to proceed with endovaginal exam following the transabdominal exam to visualize the endometrium and ovaries. COMPARISON:  None FINDINGS: Uterus Measurements: 10.0 x 5.5 x 6.4 cm = volume: 185.4 mL. No fibroids or other mass visualized. Endometrium Thickness: 7 mm. There is a solid filling defect with internal blood flow measuring 2.7 x 2.6 x 3.1 cm endometrial filling defect. Right ovary Measurements: 4.1 x 2.3 x 2.7 cm = volume: 13.6 mL. Normal appearance/no adnexal mass. Left ovary Measurements: 2.8 x 2.1 by 2.7 cm = volume: 8.2 mL. Normal appearance/no adnexal mass. Other findings No abnormal free fluid. IMPRESSION: 1. Solid filling defect within the endometrial cavity is identified measuring 3.1 cm with internal blood flow. Primary differential considerations include benign endometrial polyp or less likely endometrial carcinoma. Consider sonohysterogram for further evaluation, prior to hysteroscopy or endometrial biopsy. Electronically Signed   By: Signa Kell M.D.   On: 11/27/2020 14:07     Scheduled Meds: . pantoprazole  40 mg Oral Daily   Continuous Infusions: . ferric gluconate (FERRLECIT/NULECIT) IV 250 mg (11/27/20 0103)     LOS: 0 days    Time spent: 35 mins    PARDEEP KUMAR, MD [facility]   If 7PM-7AM, please contact night-coverage

## 2020-11-28 LAB — CBC
HCT: 27.9 % — ABNORMAL LOW (ref 36.0–46.0)
Hemoglobin: 8.1 g/dL — ABNORMAL LOW (ref 12.0–15.0)
MCH: 19.6 pg — ABNORMAL LOW (ref 26.0–34.0)
MCHC: 29 g/dL — ABNORMAL LOW (ref 30.0–36.0)
MCV: 67.6 fL — ABNORMAL LOW (ref 80.0–100.0)
Platelets: 852 10*3/uL — ABNORMAL HIGH (ref 150–400)
RBC: 4.13 MIL/uL (ref 3.87–5.11)
RDW: 29.1 % — ABNORMAL HIGH (ref 11.5–15.5)
WBC: 7.6 10*3/uL (ref 4.0–10.5)
nRBC: 0 % (ref 0.0–0.2)

## 2020-11-28 LAB — BASIC METABOLIC PANEL
Anion gap: 7 (ref 5–15)
BUN: 8 mg/dL (ref 6–20)
CO2: 24 mmol/L (ref 22–32)
Calcium: 8.9 mg/dL (ref 8.9–10.3)
Chloride: 107 mmol/L (ref 98–111)
Creatinine, Ser: 1.15 mg/dL — ABNORMAL HIGH (ref 0.44–1.00)
GFR, Estimated: >60 mL/min (ref >60)
Glucose, Bld: 98 mg/dL (ref 70–99)
Potassium: 3.5 mmol/L (ref 3.5–5.1)
Sodium: 138 mmol/L (ref 135–145)

## 2020-11-28 LAB — PHOSPHORUS: Phosphorus: 3.7 mg/dL (ref 2.5–4.6)

## 2020-11-28 LAB — MAGNESIUM: Magnesium: 2 mg/dL (ref 1.7–2.4)

## 2020-11-28 LAB — URINE CULTURE: Culture: <10000 — AB

## 2020-11-28 MED ORDER — FERROUS SULFATE 325 (65 FE) MG PO TBEC: 325.0000 mg | DELAYED_RELEASE_TABLET | Freq: Two times a day (BID) | ORAL | 3 refills | Status: DC

## 2020-11-28 NOTE — Discharge Summary (Signed)
Physician Discharge Summary  Kelli Brooks NOI:[id number] DOB: 1980-09-07 DOA: 11/26/2020  PCP: Pcp, No  Admit date: 11/26/2020   Discharge date: 11/28/2020  Admitted From: Home.  Disposition:  Home  Recommendations for Outpatient Follow-up:  1. Follow up with PCP in 1-2 weeks. 2. Please obtain BMP/CBC in one week. 3. Advised to follow-up with Gynecologist Dr. Rande Lawman in 1 week.   4. Advised to take ferrous sulfate 325 mg twice daily.  Home Health: None. Equipment/Devices: None.  Discharge Condition: Stable CODE STATUS:Full code Diet recommendation: Heart Healthy   Brief Summary / Hospital course: This 40 years old female with PMH significant for menorrhagia who presents in the ED with complaints of dizziness and chest pain.  Patient reports after she woke up in the morning,  she felt nauseous and vomited twice prior to going to work.  At work she reported going to the bathroom and while walking back she felt lightheaded and had felt like about to pass out and had substernal chest pain.  She described chest pain as tightness,  radiating towards her left arm.  She reports been dealing with a lot of stressors lately.  Her family history is significant for father having heart attack at the age of 48.  She reports her menstrual cycle has been heavy and can last up to 2 weeks.  She was found to have hemoglobin of 5.8 on admission, chest x-ray shows no abnormalities.  She has received 2 units of packed red blood cells.  EKG unremarkable.  Troponin x3 -, Patient was admitted for symptomatic anemia.  Hemoglobin improved to 8.7 after 2 units of PRBCs.  CT abdomen pelvis shows mass in the endometrium.  Pelvic ultrasound completed which showed mass in the endometrium.  Gynecologist was consulted recommended outpatient endometrial biopsy.  Patient feels better and wants to be discharged.  Patient is being discharged on iron supplements.  Advised to follow-up with Dr. Rande Lawman in 1 week.  She was managed  for below problems.  Discharge Diagnoses:  Principal Problem:   Symptomatic anemia Active Problems:   Menorrhagia   Nausea and vomiting   Chest pain   Hypokalemia   Thrombocytosis   Renal insufficiency  Near syncope secondary toiron deficiency anemia:Acute.  Patient presents after having a near syncopal episode while at work.  Hemoglobin found to be 5.8 with MCV 61, MCH 15.3, and RDW 23.4  All suggestive of iron deficiency anemia.  Anemia panel was significant for iron 10, TIBC 458, and ferritin 2.  Stool guaiacs were reported to be negative.  S/p 2 units of PRBC, hemoglobin 7.9. Ferric gluconate IV 250mg  x2 Monitor H&H. Hb remains stable.  Chest pain: Acute > Resolved. Patient reports having stabbing substernal chest pain with radiation down her left arm.  She had taken aspirin with improvement in pain symptoms.  Initial high-sensitivity troponin and an EKG without significant ischemic changes,  Continue to trend troponins. Trop x 3 negative Family history however is significant for heart attack in her brother at age 94 and father at the age of 39.  Chest x-ray did note a borderline enlarged cardiac silhouette, but suspect this is most likely secondary to anemia. Echocardiogram : LVEF 60 to 65%, no wall motion abnormalities. She needs outpatient cardiology follow up.  Nausea/ Vomiting and abdominal pain: > Resolved. Patient reports nausea and vomiting this morning, now improved  CT abdomen and pelvis shows mass in the endometrium otherwise unremarkable. Obtain pelvic ultrasound.  Consider OB/GYN consult.  Menorrhagia: Patient reports regular  menstrual cycles monthly, but are usually heavy and can last up to 2 weeks.  She is currently on her menstrual period currently at this time. -[facility]  Hypokalemia: > Resolved  Thrombocytosis:Platelet count 927. Suspect secondary patient's  severe iron deficiency. -Continue to monitor  Renal insufficiency: Acute. Creatinine 1.06 on admission.  Patient last known baseline creatinine was 0.97 back in 2017. Suspect secondary to severe anemia. -Continue to monitor kidney function   Discharge Instructions  Discharge Instructions    Call MD for:  difficulty breathing, headache or visual disturbances   Complete by: As directed    Call MD for:  persistant dizziness or light-headedness   Complete by: As directed    Call MD for:  persistant nausea and vomiting   Complete by: As directed    Diet - low sodium heart healthy   Complete by: As directed    Diet Carb Modified   Complete by: As directed    Discharge instructions   Complete by: As directed    Advised to follow-up with primary care physician in 1 week.   Advised to follow-up with gynecologist Dr. Rande Lawman in 1 week.   Advised to take ferrous sulfate 325 mg twice daily.   Increase activity slowly   Complete by: As directed      Allergies as of 11/28/2020   No Known Allergies     Medication List    TAKE these medications   ferrous sulfate 325 (65 FE) MG EC tablet Take 1 tablet (325 mg total) by mouth 2 (two) times daily.       Follow-up Information    [facility] Follow up.   Why: Jan 08, 2021 at 2:30pm  Contact information: [address] [place] [address] [phone]       Hermina Staggers, MD Follow up in 1 week(s).   Specialty: Obstetrics and Gynecology Contact information: [address] [place] [address] [phone]              No Known Allergies  Consultations:  None   Procedures/Studies: CT ABDOMEN PELVIS W CONTRAST  Result Date: 11/26/2020 CLINICAL DATA:  Abdominal pain and nausea EXAM: CT ABDOMEN AND PELVIS WITH CONTRAST TECHNIQUE: Multidetector CT imaging of the abdomen and pelvis was performed using the standard protocol following bolus  administration of intravenous contrast. Oral contrast was also administered. CONTRAST:  OMNIPAQUE IOHEXOL 300 MG/ML  SOLN COMPARISON:  None. FINDINGS: Lower chest: There is mild bibasilar atelectasis. There is no edema or airspace opacity. Hepatobiliary: No focal liver lesions are evident. Gallbladder wall is not appreciably thickened. There is no biliary duct dilatation. Pancreas: There is no pancreatic mass or inflammatory focus. Spleen: No splenic lesions are evident. Small splenules inferior to the spleen noted. Adrenals/Urinary Tract: Adrenals bilaterally appear normal. There is no evident renal mass or hydronephrosis on either side. There is no renal or ureteral calculus on either side. Urinary bladder is midline with wall thickness within normal limits. Stomach/Bowel: There is no appreciable bowel wall or mesenteric thickening. There is no demonstrable bowel obstruction. The terminal ileum appears unremarkable. No periappendiceal region inflammatory change. No free air or portal venous air. Vascular/Lymphatic: No abdominal aortic aneurysm. No arterial vascular lesions are evident. Major venous structures appear patent. There is no evident adenopathy in the abdomen or pelvis. Reproductive: The uterus is anteverted. The endometrium is thickened with an apparent mass within the  endometrium measuring 2.7 x 2.7 x 2.8 cm. Fluid tracks throughout the cervix as well. No adnexal masses are evident. Other: No appreciable ascites or abscess in the abdomen or pelvis. There is a small umbilical hernia containing fat but no bowel. Musculoskeletal: No blastic or lytic bone lesions. Small bone island in the T12 vertebral body. No intramuscular lesions. IMPRESSION: 1. Thickened endometrium with an apparent mass within the endometrium measuring 2.7 x 2.7 x 2.8 cm. There is also fluid throughout the cervix. Pelvic ultrasound advised to further evaluate. 2. No evident bowel wall thickening or bowel obstruction. No abscess  in the abdomen or pelvis. Appendix appears normal. 3. No evident renal or ureteral calculus. No hydronephrosis. Urinary bladder wall thickness is within normal limits. These results will be called to the ordering clinician or representative by the Radiologist Assistant, and communication documented in the PACS or [employer]. Electronically Signed   By: Bretta Bang III M.D.   On: 11/26/2020 19:26   DG Chest Portable 1 View  Result Date: 11/26/2020 CLINICAL DATA:  Cough, mid chest pain EXAM: PORTABLE CHEST 1 VIEW COMPARISON:  Portable exam 1013 hours compared to 06/13/2016 FINDINGS: Borderline enlargement of cardiac silhouette. Mediastinal contours and pulmonary vascularity normal. Lungs clear. No infiltrate, pleural effusion or pneumothorax. Osseous structures unremarkable. IMPRESSION: No acute abnormalities. Electronically Signed   By: Ulyses Southward M.D.   On: 11/26/2020 10:34   ECHOCARDIOGRAM COMPLETE  Result Date: 11/27/2020    ECHOCARDIOGRAM REPORT   Patient Name:   Kelli Brooks Date of Exam: 11/27/2020 Medical Rec #:  [id number]         Height:       62.0 in Accession #:    [id number]        Weight:       195.0 lb Date of Birth:  1980/09/18         BSA:          1.891 m Patient Age:    39 years          BP:           127/79 mmHg Patient Gender: F                 HR:           62 bpm. Exam Location:  Inpatient Procedure: 2D Echo, Cardiac Doppler and Color Doppler Indications:    Chest Pain R07.9  History:        Patient has no prior history of Echocardiogram examinations.                 Risk Factors:Non-Smoker.  Sonographer:    Renella Cunas RDCS Referring Phys: [id number] RONDELL A SMITH IMPRESSIONS  1. Left ventricular ejection fraction, by estimation, is 60 to 65%. The left ventricle has normal function. The left ventricle has no regional wall motion abnormalities. Left ventricular diastolic parameters were normal.  2. Right ventricular systolic function is normal. The right ventricular size  is normal. There is normal pulmonary artery systolic pressure. The estimated right ventricular systolic pressure is 25.5 mmHg.  3. Left atrial size was mildly dilated.  4. The mitral valve is normal in structure. Mild mitral valve regurgitation. No evidence of mitral stenosis.  5. The aortic valve is normal in structure. Aortic valve regurgitation is not visualized. No aortic stenosis is present.  6. The inferior vena cava is normal in size with greater than 50% respiratory variability, suggesting right atrial pressure of 3 mmHg. FINDINGS  Left  Ventricle: Left ventricular ejection fraction, by estimation, is 60 to 65%. The left ventricle has normal function. The left ventricle has no regional wall motion abnormalities. The left ventricular internal cavity size was normal in size. There is  no left ventricular hypertrophy. Left ventricular diastolic parameters were normal. Normal left ventricular filling pressure. Right Ventricle: The right ventricular size is normal. No increase in right ventricular wall thickness. Right ventricular systolic function is normal. There is normal pulmonary artery systolic pressure. The tricuspid regurgitant velocity is 2.37 m/s, and  with an assumed right atrial pressure of 3 mmHg, the estimated right ventricular systolic pressure is 25.5 mmHg. Left Atrium: Left atrial size was mildly dilated. Right Atrium: Right atrial size was normal in size. Pericardium: There is no evidence of pericardial effusion. Mitral Valve: The mitral valve is normal in structure. Mild mitral valve regurgitation. No evidence of mitral valve stenosis. Tricuspid Valve: The tricuspid valve is normal in structure. Tricuspid valve regurgitation is trivial. No evidence of tricuspid stenosis. Aortic Valve: The aortic valve is normal in structure. Aortic valve regurgitation is not visualized. No aortic stenosis is present. Pulmonic Valve: The pulmonic valve was normal in structure. Pulmonic valve regurgitation is not  visualized. No evidence of pulmonic stenosis. Aorta: The aortic root is normal in size and structure. Venous: The inferior vena cava is normal in size with greater than 50% respiratory variability, suggesting right atrial pressure of 3 mmHg. IAS/Shunts: No atrial level shunt detected by color flow Doppler.  LEFT VENTRICLE PLAX 2D LVIDd:         5.30 cm      Diastology LVIDs:         3.50 cm      LV e' medial:    7.87 cm/s LV PW:         0.90 cm      LV E/e' medial:  8.6 LV IVS:        0.90 cm      LV e' lateral:   10.70 cm/s LVOT diam:     2.10 cm      LV E/e' lateral: 6.3 LV SV:         67 LV SV Index:   35 LVOT Area:     3.46 cm  LV Volumes (MOD) LV vol d, MOD A2C: 135.0 ml LV vol d, MOD A4C: 116.0 ml LV vol s, MOD A2C: 55.9 ml LV vol s, MOD A4C: 50.3 ml LV SV MOD A2C:     79.1 ml LV SV MOD A4C:     116.0 ml LV SV MOD BP:      72.7 ml RIGHT VENTRICLE RV S prime:     12.50 cm/s TAPSE (M-mode): 1.7 cm LEFT ATRIUM             Index       RIGHT ATRIUM           Index LA diam:        3.80 cm 2.01 cm/m  RA Area:     18.10 cm LA Vol (A2C):   54.2 ml 28.66 ml/m RA Volume:   52.10 ml  27.55 ml/m LA Vol (A4C):   48.7 ml 25.75 ml/m LA Biplane Vol: 53.3 ml 28.18 ml/m  AORTIC VALVE LVOT Vmax:   91.60 cm/s LVOT Vmean:  63.400 cm/s LVOT VTI:    0.193 m  AORTA Ao Root diam: 3.10 cm Ao Asc diam:  3.30 cm MITRAL VALVE  TRICUSPID VALVE MV Area (PHT): 3.54 cm    TR Peak grad:   22.5 mmHg MV Decel Time: 214 msec    TR Vmax:        237.00 cm/s MV E velocity: 67.70 cm/s MV A velocity: 48.00 cm/s  SHUNTS MV E/A ratio:  1.41        Systemic VTI:  0.19 m                            Systemic Diam: 2.10 cm Rachelle HoraMihai Croitoru MD Electronically signed by Thurmon FairMihai Croitoru MD Signature Date/Time: 11/27/2020/2:00:24 PM    Final    US PELVIC COMPLETE WITH TRANSVAGINAL  Result Date: 11/27/2020 CLINICAL DATA:  Evaluate mass in endometrium. EXAM: TRANSABDOMINAL AND TRANSVAGINAL ULTRASOUND OF PELVIS TECHNIQUE: Both transabdominal and  transvaginal ultrasound examinations of the pelvis were performed. Transabdominal technique was performed for global imaging of the pelvis including uterus, ovaries, adnexal regions, and pelvic cul-de-sac. It was necessary to proceed with endovaginal exam following the transabdominal exam to visualize the endometrium and ovaries. COMPARISON:  None FINDINGS: Uterus Measurements: 10.0 x 5.5 x 6.4 cm = volume: 185.4 mL. No fibroids or other mass visualized. Endometrium Thickness: 7 mm. There is a solid filling defect with internal blood flow measuring 2.7 x 2.6 x 3.1 cm endometrial filling defect. Right ovary Measurements: 4.1 x 2.3 x 2.7 cm = volume: 13.6 mL. Normal appearance/no adnexal mass. Left ovary Measurements: 2.8 x 2.1 by 2.7 cm = volume: 8.2 mL. Normal appearance/no adnexal mass. Other findings No abnormal free fluid. IMPRESSION: 1. Solid filling defect within the endometrial cavity is identified measuring 3.1 cm with internal blood flow. Primary differential considerations include benign endometrial polyp or less likely endometrial carcinoma. Consider sonohysterogram for further evaluation, prior to hysteroscopy or endometrial biopsy. Electronically Signed   By: Signa Kellaylor  Stroud M.D.   On: 11/27/2020 14:07   Echocardiogram, pelvic ultrasound.   Subjective: Patient was seen and examined at bedside.  Overnight events noted.  Patient reports feeling much better.  Patient hemoglobin has been stable.  Patient chest pain has resolved.  Patient wants to be discharged.  Discharge Exam: Vitals:   11/27/20 2139 11/28/20 0400  BP: 123/77 115/72  Pulse: 74 73  Resp:    Temp: 98.4 F (36.9 C) 98.3 F (36.8 C)  SpO2: 99% 100%   Vitals:   11/27/20 1253 11/27/20 2139 11/27/20 2139 11/28/20 0400  BP: 120/82 123/77 123/77 115/72  Pulse: 69 74 74 73  Resp: 18 18    Temp: 98.3 F (36.8 C) 98.4 F (36.9 C) 98.4 F (36.9 C) 98.3 F (36.8 C)  TempSrc: Oral Oral Oral Oral  SpO2: 95% 99% 99% 100%   Weight:      Height:        General: Pt is alert, awake, not in acute distress Cardiovascular: RRR, S1/S2 +, no rubs, no gallops Respiratory: CTA bilaterally, no wheezing, no rhonchi Abdominal: Soft, NT, ND, bowel sounds + Extremities: no edema, no cyanosis    The results of significant diagnostics from this hospitalization (including imaging, microbiology, ancillary and laboratory) are listed below for reference.     Microbiology: Recent Results (from the past 240 hour(s))  SARS CORONAVIRUS 2 (TAT 6-24 HRS) Nasopharyngeal Nasopharyngeal Swab     Status: None   Collection Time: 11/26/20 10:42 AM   Specimen: Nasopharyngeal Swab  Result Value Ref Range Status   SARS Coronavirus 2 NEGATIVE NEGATIVE Final  Comment: (NOTE) SARS-CoV-2 target nucleic acids are NOT DETECTED.  The SARS-CoV-2 RNA is generally detectable in upper and lower respiratory specimens during the acute phase of infection. Negative results do not preclude SARS-CoV-2 infection, do not rule out co-infections with other pathogens, and should not be used as the sole basis for treatment or other patient management decisions. Negative results must be combined with clinical observations, patient history, and epidemiological information. The expected result is Negative.  Fact Sheet for Patients: HairSlick.no  Fact Sheet for Healthcare Providers: quierodirigir.com  This test is not yet approved or cleared by the [place] FDA and  has been authorized for detection and/or diagnosis of SARS-CoV-2 by FDA under an Emergency Use Authorization (EUA). This EUA will remain  in effect (meaning this test can be used) for the duration of the COVID-19 declaration under Se ction 564(b)(1) of the Act, 21 U.S.C. section 360bbb-3(b)(1), unless the authorization is terminated or revoked sooner.  Performed at [facility], [address], [place],  [place] [address]      Labs: BNP (last 3 results) No results for input(s): BNP in the last 8760 hours. Basic Metabolic Panel: Recent Labs  Lab 11/26/20 0942 11/27/20 0131 11/28/20 0318  NA 139 138 138  K 3.4* 3.5 3.5  CL 107 109 107  CO2 GLUCOSE 103* 93 98  BUN CREATININE 1.06* 1.08* 1.15*  CALCIUM 9.1 8.5* 8.9  MG  --   --  2.0  PHOS  --   --  3.7   Liver Function Tests: Recent Labs  Lab 11/26/20 0942  AST 24  ALT 16  ALKPHOS 51  BILITOT 0.4  PROT 7.1  ALBUMIN 3.8   Recent Labs  Lab 11/26/20 0942  LIPASE 44   No results for input(s): AMMONIA in the last 168 hours. CBC: Recent Labs  Lab 11/26/20 0942 11/27/20 0131 11/27/20 1551 11/28/20 0318  WBC 5.2 6.8  --  7.6  NEUTROABS 3.8  --   --   --   HGB 5.8* 7.9* 8.6* 8.1*  HCT 23.5* 26.6* 28.9* 27.9*  MCV 61.8* 66.7*  --  67.6*  PLT 927* 837*  --  852*   Cardiac Enzymes: No results for input(s): CKTOTAL, CKMB, CKMBINDEX, TROPONINI in the last 168 hours. BNP: Invalid input(s): POCBNP CBG: No results for input(s): GLUCAP in the last 168 hours. D-Dimer No results for input(s): DDIMER in the last 72 hours. Hgb A1c No results for input(s): HGBA1C in the last 72 hours. Lipid Profile Recent Labs    11/27/20 0131  CHOL 158  HDL 49  LDLCALC 94  TRIG 75  CHOLHDL 3.2   Thyroid function studies No results for input(s): TSH, T4TOTAL, T3FREE, THYROIDAB in the last 72 hours.  Invalid input(s): FREET3 Anemia work up Recent Labs    11/26/20 1041  VITAMINB12 522  FOLATE 6.4  FERRITIN 2*  TIBC 458*  IRON 10*  RETICCTPCT 1.6   Urinalysis    Component Value Date/Time   COLORURINE YELLOW 11/26/2020 1634   APPEARANCEUR CLEAR 11/26/2020 1634   LABSPEC 1.010 11/26/2020 1634   PHURINE 6.0 11/26/2020 1634   GLUCOSEU NEGATIVE 11/26/2020 1634   HGBUR LARGE (A) 11/26/2020 1634   BILIRUBINUR NEGATIVE 11/26/2020 1634   KETONESUR NEGATIVE 11/26/2020 1634   PROTEINUR NEGATIVE 11/26/2020 1634    UROBILINOGEN 0.2 08/06/2011 0621   NITRITE NEGATIVE 11/26/2020 1634   LEUKOCYTESUR TRACE (A) 11/26/2020 1634   Sepsis Labs Invalid input(s):  PROCALCITONIN,  WBC,  LACTICIDVEN Microbiology Recent Results (from the past 240 hour(s))  SARS CORONAVIRUS 2 (TAT 6-24 HRS) Nasopharyngeal Nasopharyngeal Swab     Status: None   Collection Time: 11/26/20 10:42 AM   Specimen: Nasopharyngeal Swab  Result Value Ref Range Status   SARS Coronavirus 2 NEGATIVE NEGATIVE Final    Comment: (NOTE) SARS-CoV-2 target nucleic acids are NOT DETECTED.  The SARS-CoV-2 RNA is generally detectable in upper and lower respiratory specimens during the acute phase of infection. Negative results do not preclude SARS-CoV-2 infection, do not rule out co-infections with other pathogens, and should not be used as the sole basis for treatment or other patient management decisions. Negative results must be combined with clinical observations, patient history, and epidemiological information. The expected result is Negative.  Fact Sheet for Patients: HairSlick.no  Fact Sheet for Healthcare Providers: quierodirigir.com  This test is not yet approved or cleared by the [place] FDA and  has been authorized for detection and/or diagnosis of SARS-CoV-2 by FDA under an Emergency Use Authorization (EUA). This EUA will remain  in effect (meaning this test can be used) for the duration of the COVID-19 declaration under Se ction 564(b)(1) of the Act, 21 U.S.C. section 360bbb-3(b)(1), unless the authorization is terminated or revoked sooner.  Performed at [facility], [address] [place], [place] [address]      Time coordinating discharge: Over 30 minutes  SIGNED:   Cipriano Bunker, MD  [facility] 11/28/2020, 10:45 AM Pager   If 7PM-7AM, please contact night-coverage [email or web address]

## 2020-11-28 NOTE — Discharge Instructions (Signed)
Advised to follow-up with gynecologist Dr. Rande Lawman in 1 week.   Advised to take ferrous sulfate 325 mg twice daily.

## 2020-11-30 ENCOUNTER — Telehealth: Payer: Self-pay

## 2020-11-30 NOTE — Telephone Encounter (Signed)
Attempted to reach patient to schedule follow up hospital appointment. No answer. Patient does not have mychart. Armandina Stammer RN

## 2021-01-08 ENCOUNTER — Inpatient Hospital Stay: Payer: Self-pay | Admitting: Internal Medicine

## 2021-01-11 ENCOUNTER — Emergency Department (HOSPITAL_COMMUNITY)
Admission: EM | Admit: 2021-01-11 | Discharge: 2021-01-11 | Disposition: A | Discharge status: Home / Self Care | Payer: Self-pay | Attending: Emergency Medicine | Admitting: Emergency Medicine

## 2021-01-11 ENCOUNTER — Emergency Department (HOSPITAL_COMMUNITY): Payer: Self-pay

## 2021-01-11 DIAGNOSIS — R079 Chest pain, unspecified: Secondary | ICD-10-CM

## 2021-01-11 DIAGNOSIS — R0789 Other chest pain: Secondary | ICD-10-CM | POA: Insufficient documentation

## 2021-01-11 LAB — TROPONIN I (HIGH SENSITIVITY): Troponin I (High Sensitivity): 11 ng/L (ref <18)

## 2021-01-11 LAB — BASIC METABOLIC PANEL
Anion gap: 7 (ref 5–15)
BUN: 12 mg/dL (ref 6–20)
CO2: 25 mmol/L (ref 22–32)
Calcium: 9 mg/dL (ref 8.9–10.3)
Chloride: 106 mmol/L (ref 98–111)
Creatinine, Ser: 0.96 mg/dL (ref 0.44–1.00)
GFR, Estimated: >60 mL/min (ref >60)
Glucose, Bld: 91 mg/dL (ref 70–99)
Potassium: 3.3 mmol/L — ABNORMAL LOW (ref 3.5–5.1)
Sodium: 138 mmol/L (ref 135–145)

## 2021-01-11 LAB — CBC
HCT: 35.6 % — ABNORMAL LOW (ref 36.0–46.0)
Hemoglobin: 10.5 g/dL — ABNORMAL LOW (ref 12.0–15.0)
MCH: 23.4 pg — ABNORMAL LOW (ref 26.0–34.0)
MCHC: 29.5 g/dL — ABNORMAL LOW (ref 30.0–36.0)
MCV: 79.5 fL — ABNORMAL LOW (ref 80.0–100.0)
Platelets: 406 10*3/uL — ABNORMAL HIGH (ref 150–400)
RBC: 4.48 MIL/uL (ref 3.87–5.11)
RDW: 26.7 % — ABNORMAL HIGH (ref 11.5–15.5)
WBC: 6.6 10*3/uL (ref 4.0–10.5)
nRBC: 0 % (ref 0.0–0.2)

## 2021-01-11 LAB — I-STAT BETA HCG BLOOD, ED (MC, WL, AP ONLY): I-stat hCG, quantitative: <5 m[IU]/mL (ref <5)

## 2021-01-11 MED ORDER — IBUPROFEN 600 MG PO TABS: 600.0000 mg | ORAL_TABLET | Freq: Three times a day (TID) | ORAL | 0 refills | Status: AC

## 2021-01-11 MED ORDER — ACETAMINOPHEN 500 MG PO TABS
1000.0000 mg | ORAL_TABLET | Freq: Once | ORAL | Status: AC
  Administered 2021-01-11: 1000 mg via ORAL
  Filled 2021-01-11: qty 2

## 2021-01-11 MED ORDER — FAMOTIDINE 40 MG PO TABS: 40.0000 mg | ORAL_TABLET | Freq: Every day | ORAL | 0 refills | Status: DC

## 2021-01-11 NOTE — ED Notes (Signed)
Patient verbalizes understanding of discharge instructions. Opportunity for questioning and answers were provided. Pt discharged from ED. 

## 2021-01-11 NOTE — Discharge Instructions (Signed)
Take the ibuprofen and pepcid as prescribed for 7 days to see if it improves your symptoms. Please come back for severely worsened chest pain, shortness of breath, passing out, or other emergencies. Otherwise see another doctor in the next week to re-evaluate your symptoms

## 2021-01-11 NOTE — ED Triage Notes (Signed)
Pt BIBA with chest pain that worsens with inhalation. Hx of anemia requiring blood transfusions last month. EMS 145/85, HR 70, 100% RA. CBG 102. Pt has complaints of dizziness too.

## 2021-01-11 NOTE — ED Provider Notes (Signed)
[facility] EMERGENCY DEPARTMENT Provider Note   CSN: [id number] Arrival date & time: 01/11/21  0865     History Chief Complaint  Patient presents with  . Chest Pain    Kelli Brooks is a 40 y.o. female.  HPI  Presents with chest pain.  Started yesterday evening.  Feels that in the left side of her chest and it sometimes moves to the right and back to the left.  Feels like a aching sensation.  Constant in duration since it started.  She has had this pain before in March whenever she was found to have severe anemia to 5.8 and required transfusion and admission to the hospital.  No difficulty breathing.  It is worse with exertion.  She feels lightheaded whenever she exerts herself or when she bends over to tie her shoes, no nausea or diaphoresis.  She has not been taking her iron pills for couple of weeks until last night and was not able to follow-up with her doctor after previous admission.  She has a history of menorrhagia and last time.  At the end of April it was heavy with clots.  No vaginal bleeding now.  Stools are normal color, light brown  HPI: A 40 year old patient with a history of obesity presents for evaluation of chest pain. Initial onset of pain was less than one hour ago. The patient's chest pain is worse with exertion. The patient's chest pain is middle- or left-sided, is not well-localized, is not described as heaviness/pressure/tightness, is not sharp and does radiate to the arms/jaw/neck. The patient does not complain of nausea and denies diaphoresis. The patient has no history of stroke, has no history of peripheral artery disease, has not smoked in the past 90 days, denies any history of treated diabetes, has no relevant family history of coronary artery disease (first degree relative at less than age 56), is not hypertensive and has no history of hypercholesterolemia.   No past medical history on file.  Patient Active Problem List   Diagnosis Date  Noted  . Symptomatic anemia 11/26/2020  . Menorrhagia 11/26/2020  . Nausea and vomiting 11/26/2020  . Chest pain 11/26/2020  . Hypokalemia 11/26/2020  . Thrombocytosis 11/26/2020  . Renal insufficiency 11/26/2020    Past Surgical History:  Procedure Laterality Date  . CESAREAN SECTION       OB History   No obstetric history on file.     Family History  Problem Relation Age of Onset  . Heart attack Brother 47  . Heart attack Father 51    Social History   Tobacco Use  . Smoking status: Never Smoker  . Smokeless tobacco: Never Used  Vaping Use  . Vaping Use: Never used  Substance Use Topics  . Alcohol use: Yes    Comment: occ  . Drug use: No    Home Medications Prior to Admission medications   Medication Sig Start Date End Date Taking? Authorizing Provider  famotidine (PEPCID) 40 MG tablet Take 1 tablet (40 mg total) by mouth daily for 7 days. 01/11/21 01/18/21 Yes Jacklynn Bue, MD  ibuprofen (ADVIL) 600 MG tablet Take 1 tablet (600 mg total) by mouth 3 (three) times daily for 7 days. 01/11/21 01/18/21 Yes Jacklynn Bue, MD  ferrous sulfate 325 (65 FE) MG EC tablet Take 1 tablet (325 mg total) by mouth 2 (two) times daily. 11/28/20 11/28/21  Cipriano Bunker, MD    Allergies    Patient has no known allergies.  Review of Systems  Review of Systems  Constitutional: Negative for chills and fever.  HENT: Negative for ear pain and sore throat.   Eyes: Negative for pain and visual disturbance.  Respiratory: Negative for cough and shortness of breath.   Cardiovascular: Positive for chest pain. Negative for palpitations.  Gastrointestinal: Negative for abdominal pain and vomiting.  Genitourinary: Positive for vaginal bleeding. Negative for dysuria and hematuria.  Musculoskeletal: Negative for arthralgias and back pain.  Skin: Negative for color change and rash.  Neurological: Negative for seizures and syncope.  All other systems reviewed and are  negative.   Physical Exam Updated Vital Signs BP 117/77   Pulse 63   Temp 98.3 F (36.8 C) (Oral)   Resp (!) 22   Ht 5\' 2"  (1.575 m)   Wt 88.5 kg   LMP 12/23/2020   SpO2 100%   BMI 35.67 kg/m   Physical Exam Vitals and nursing note reviewed.  Constitutional:      General: She is not in acute distress.    Appearance: She is well-developed.  HENT:     Head: Normocephalic and atraumatic.  Eyes:     Conjunctiva/sclera: Conjunctivae normal.  Cardiovascular:     Rate and Rhythm: Normal rate and regular rhythm.     Heart sounds: No murmur heard.   Pulmonary:     Effort: Pulmonary effort is normal. No respiratory distress.     Breath sounds: Normal breath sounds.  Abdominal:     Palpations: Abdomen is soft.     Tenderness: There is no abdominal tenderness.  Musculoskeletal:     Cervical back: Neck supple.     Right lower leg: No edema.     Left lower leg: No edema.  Skin:    General: Skin is warm and dry.  Neurological:     Mental Status: She is alert.     ED Results / Procedures / Treatments   Labs (all labs ordered are listed, but only abnormal results are displayed) Labs Reviewed  BASIC METABOLIC PANEL - Abnormal; Notable for the following components:      Result Value   Potassium 3.3 (*)    All other components within normal limits  CBC - Abnormal; Notable for the following components:   Hemoglobin 10.5 (*)    HCT 35.6 (*)    MCV 79.5 (*)    MCH 23.4 (*)    MCHC 29.5 (*)    RDW 26.7 (*)    Platelets 406 (*)    All other components within normal limits  I-STAT BETA HCG BLOOD, ED ([facility], [facility], [facility] ONLY)  TROPONIN I (HIGH SENSITIVITY)  TROPONIN I (HIGH SENSITIVITY)    EKG EKG Interpretation  Date/Time:  Monday Jan 11 2021 09:10:14 EDT Ventricular Rate:  65 PR Interval:  158 QRS Duration: 78 QT Interval:  402 QTC Calculation: 418 R Axis:   67 Text Interpretation: Normal sinus rhythm Low voltage QRS Cannot rule out Anterior infarct , age undetermined  Abnormal ECG Confirmed by 01[birth year]1993 [phone]) on 01/11/2021 10:04:37 AM   Radiology DG Chest 2 View  Result Date: 01/11/2021 CLINICAL DATA:  Chest pain and shortness of breath EXAM: CHEST - 2 VIEW COMPARISON:  November 26, 2020 FINDINGS: Lungs are clear. Borderline cardiac enlargement is stable. Pulmonary vascularity is normal. No adenopathy. No pneumothorax. No bone lesions. IMPRESSION: Lungs clear.  Stable cardiac prominence. Electronically Signed   By: November 28, 2020 III M.D.   On: 01/11/2021 09:54    Procedures Procedures   Medications Ordered in  ED Medications  acetaminophen (TYLENOL) tablet 1,000 mg (1,000 mg Oral Given 01/11/21 1018)    ED Course  I have reviewed the triage vital signs and the nursing notes.  Pertinent labs & imaging results that were available during my care of the patient were reviewed by me and considered in my medical decision making (see chart for details).    MDM Rules/Calculators/A&P HEAR Score: 3                        Differential diagnosis includes ACS, symptomatic anemia; other etiologies such as pulmonary embolus less likely given that her heart rate is normal, she does not take estrogen, has no focal leg swelling, no difficulty breathing.  Heart score 3.  Will defer nitroglycerin given the differential diagnosis above; Tylenol ordered for pain.  No signs or symptoms consistent with dissection, myocarditis, pericarditis.  EKG reviewed by me; difficult to evaluate for T wave inversion in lead III due to baseline noise however no focal ischemic pattern, no ST changes, no conduction delays. Two-view chest reviewed by me; no focal consolidation, no mediastinal widening or cardiomegaly, no effusion. Labs reviewed by me and show no acute focal abnormality. No chest pain change in several hours; repeat Tn not indicated. HEARS 3 PERC history reviewed and is negative Ddx now includes inflammatory etiology; pleurisy post-covid, not overall consistent with  reflux but worse laying flat. Pt feels comfortable with dc at this time; given that ACS, new onset HF, PERC negative, VSS, stable and appropriate for discharge.  Discussed the importance of outpatient primary care doctor follow-up within a week.  Also reviewed symptomatic treatment with Pepcid and ibuprofen and the patient agrees   Final Clinical Impression(s) / ED Diagnoses Final diagnoses:  Chest pain, unspecified type    Rx / DC Orders ED Discharge Orders         Ordered    ibuprofen (ADVIL) 600 MG tablet  3 times daily        01/11/21 1129    famotidine (PEPCID) 40 MG tablet  Daily        01/11/21 1129           Jacklynn Bue, MD 01/11/21 1132    Vanetta Mulders, MD 01/20/21 [phone]

## 2021-08-19 ENCOUNTER — Emergency Department (HOSPITAL_COMMUNITY)
Admission: EM | Admit: 2021-08-19 | Discharge: 2021-08-20 | Disposition: A | Discharge status: Home / Self Care | Payer: Self-pay | Attending: Emergency Medicine | Admitting: Emergency Medicine

## 2021-08-19 ENCOUNTER — Other Ambulatory Visit: Payer: Self-pay

## 2021-08-19 ENCOUNTER — Encounter (HOSPITAL_COMMUNITY): Payer: Self-pay | Admitting: Pharmacy Technician

## 2021-08-19 DIAGNOSIS — R0602 Shortness of breath: Secondary | ICD-10-CM | POA: Insufficient documentation

## 2021-08-19 DIAGNOSIS — D649 Anemia, unspecified: Secondary | ICD-10-CM | POA: Insufficient documentation

## 2021-08-19 DIAGNOSIS — N9489 Other specified conditions associated with female genital organs and menstrual cycle: Secondary | ICD-10-CM | POA: Insufficient documentation

## 2021-08-19 DIAGNOSIS — R079 Chest pain, unspecified: Secondary | ICD-10-CM | POA: Insufficient documentation

## 2021-08-19 DIAGNOSIS — Z20822 Contact with and (suspected) exposure to covid-19: Secondary | ICD-10-CM | POA: Insufficient documentation

## 2021-08-19 LAB — COMPREHENSIVE METABOLIC PANEL
ALT: 14 U/L (ref 0–44)
AST: 30 U/L (ref 15–41)
Albumin: 3.6 g/dL (ref 3.5–5.0)
Alkaline Phosphatase: 51 U/L (ref 38–126)
Anion gap: 9 (ref 5–15)
BUN: 9 mg/dL (ref 6–20)
CO2: 22 mmol/L (ref 22–32)
Calcium: 8.8 mg/dL — ABNORMAL LOW (ref 8.9–10.3)
Chloride: 105 mmol/L (ref 98–111)
Creatinine, Ser: 0.9 mg/dL (ref 0.44–1.00)
GFR, Estimated: >60 mL/min (ref >60)
Glucose, Bld: 92 mg/dL (ref 70–99)
Potassium: 3.5 mmol/L (ref 3.5–5.1)
Sodium: 136 mmol/L (ref 135–145)
Total Bilirubin: 0.4 mg/dL (ref 0.3–1.2)
Total Protein: 6.7 g/dL (ref 6.5–8.1)

## 2021-08-19 LAB — IRON AND TIBC
Iron: 24 ug/dL — ABNORMAL LOW (ref 28–170)
Saturation Ratios: 5 % — ABNORMAL LOW (ref 10.4–31.8)
TIBC: 497 ug/dL — ABNORMAL HIGH (ref 250–450)
UIBC: 473 ug/dL

## 2021-08-19 LAB — CBC WITH DIFFERENTIAL/PLATELET
Abs Immature Granulocytes: 0.01 10*3/uL (ref 0.00–0.07)
Basophils Absolute: 0.1 10*3/uL (ref 0.0–0.1)
Basophils Relative: 1 %
Eosinophils Absolute: 0.1 10*3/uL (ref 0.0–0.5)
Eosinophils Relative: 1 %
HCT: 28.7 % — ABNORMAL LOW (ref 36.0–46.0)
Hemoglobin: 8.3 g/dL — ABNORMAL LOW (ref 12.0–15.0)
Immature Granulocytes: 0 %
Lymphocytes Relative: 28 %
Lymphs Abs: 1.7 10*3/uL (ref 0.7–4.0)
MCH: 19.7 pg — ABNORMAL LOW (ref 26.0–34.0)
MCHC: 28.9 g/dL — ABNORMAL LOW (ref 30.0–36.0)
MCV: 68 fL — ABNORMAL LOW (ref 80.0–100.0)
Monocytes Absolute: 0.4 10*3/uL (ref 0.1–1.0)
Monocytes Relative: 6 %
Neutro Abs: 3.9 10*3/uL (ref 1.7–7.7)
Neutrophils Relative %: 64 %
Platelets: 370 10*3/uL (ref 150–400)
RBC: 4.22 MIL/uL (ref 3.87–5.11)
RDW: 21.2 % — ABNORMAL HIGH (ref 11.5–15.5)
WBC: 6 10*3/uL (ref 4.0–10.5)
nRBC: 0 % (ref 0.0–0.2)

## 2021-08-19 LAB — RETICULOCYTES
Immature Retic Fract: 36.2 % — ABNORMAL HIGH (ref 2.3–15.9)
RBC.: 4.18 MIL/uL (ref 3.87–5.11)
Retic Count, Absolute: 58.9 10*3/uL (ref 19.0–186.0)
Retic Ct Pct: 1.4 % (ref 0.4–3.1)

## 2021-08-19 LAB — VITAMIN B12: Vitamin B-12: 514 pg/mL (ref 180–914)

## 2021-08-19 LAB — I-STAT BETA HCG BLOOD, ED (MC, WL, AP ONLY): I-stat hCG, quantitative: <5 m[IU]/mL (ref <5)

## 2021-08-19 LAB — FOLATE: Folate: 9.6 ng/mL (ref >5.9)

## 2021-08-19 LAB — RESP PANEL BY RT-PCR (FLU A&B, COVID) ARPGX2
Influenza A by PCR: NEGATIVE
Influenza B by PCR: NEGATIVE
SARS Coronavirus 2 by RT PCR: NEGATIVE

## 2021-08-19 LAB — FERRITIN: Ferritin: 3 ng/mL — ABNORMAL LOW (ref 11–307)

## 2021-08-19 LAB — PREPARE RBC (CROSSMATCH)

## 2021-08-19 MED ORDER — SODIUM CHLORIDE 0.9 % IV SOLN: 10.0000 mL/h | Freq: Once | INTRAVENOUS | Status: DC

## 2021-08-19 MED ORDER — SENNOSIDES-DOCUSATE SODIUM 8.6-50 MG PO TABS: 1.0000 | ORAL_TABLET | Freq: Every day | ORAL | 1 refills | Status: AC

## 2021-08-19 MED ORDER — SODIUM CHLORIDE 0.9% FLUSH: 10.0000 mL | INTRAVENOUS | Status: DC | PRN

## 2021-08-19 MED ORDER — SODIUM CHLORIDE 0.9% FLUSH: 10.0000 mL | Freq: Two times a day (BID) | INTRAVENOUS | Status: DC

## 2021-08-19 NOTE — ED Notes (Signed)
IV team unsuccessful with IV placement.  Another IV team nurse is paged to come down and attempt

## 2021-08-19 NOTE — ED Provider Notes (Signed)
Emergency Medicine Provider Triage Evaluation Note  Kelli Brooks , a 40 y.o. female  was evaluated in triage.  Pt complains of feeling light headed and dizzy. She has a history of anemia and didn't follow up with ob/gyn    Review of Systems  Positive: See above Negative:   Physical Exam  There were no vitals taken for this visit. HR 61, BP 137/90, spo2 100% temp 98.1 Gen:   Awake, no distress   Resp:  Normal effort  MSK:   Moves extremities without difficulty  Other:  Patient is awake and alert.  Speech is not slurred.    Medical Decision Making  Medically screening exam initiated at 3:46 PM.  Appropriate orders placed.  Kelli Brooks was informed that the remainder of the evaluation will be completed by another provider, this initial triage assessment does not replace that evaluation, and the importance of remaining in the ED until their evaluation is complete.  Labs ordered.  Patient states she was told to follow-up with OB/GYN for her heavy menstrual cycles that normally last about 2 weeks but did not.  No thinners reported.  Labs and anemia panel ordered.   [name], [place] 08/19/21 1557    Tegeler, Canary Brim, MD 08/19/21 [phone]

## 2021-08-19 NOTE — Discharge Instructions (Addendum)
Please schedule follow-up appointment with a gynecologist for your persistent bleeding issues.  Your menstrual bleeding could be causing her anemia.  It is very important that you start taking the iron as prescribed to help with the anemia.  You can also take ibuprofen 600 mg every 8 hours during her menstrual cycle to help with the bleeding.  Iron can cause constipation.  You can start by taking Colace stool softener with iron every day.  If you are still having constipation, I provided you a prescription for Peri-Colace, which has some laxative in it as well.  Drink plenty of water moving forward.

## 2021-08-19 NOTE — ED Provider Notes (Signed)
[facility] EMERGENCY DEPARTMENT Provider Note   CSN: [id number] Arrival date & time: 08/19/21  1246     History CC: Shortness of breath, fatigue   Kelli Brooks is a 40 y.o. female with a history of symptomatic iron deficiency anemia, likely related to heavy menstrual bleeding, presenting to the ED with chest pain and lightheadedness.  The patient reports has had the symptoms for several days, worsening.  She says this feels exactly like her symptomatic anemia in the past, for which she was once hospitalized this year, per medical record review.  She has had blood transfusions in the past.  She has been prescribed iron, but only filled the prescription yesterday.  She has not been taking iron.  She reports that she is not actively having her menses, but did have it 2 weeks ago.  It is very heavy and last about a week.  She does not have a gynecologist.    HPI     History reviewed. No pertinent past medical history.  Patient Active Problem List   Diagnosis Date Noted   Symptomatic anemia 11/26/2020   Menorrhagia 11/26/2020   Nausea and vomiting 11/26/2020   Chest pain 11/26/2020   Hypokalemia 11/26/2020   Thrombocytosis 11/26/2020   Renal insufficiency 11/26/2020    Past Surgical History:  Procedure Laterality Date   CESAREAN SECTION       OB History   No obstetric history on file.     Family History  Problem Relation Age of Onset   Heart attack Brother 66   Heart attack Father 49    Social History   Tobacco Use   Smoking status: Never   Smokeless tobacco: Never  Vaping Use   Vaping Use: Never used  Substance Use Topics   Alcohol use: Yes    Comment: occ   Drug use: No    Home Medications Prior to Admission medications   Medication Sig Start Date End Date Taking? Authorizing Provider  senna-docusate (SENOKOT-S) 8.6-50 MG tablet Take 1 tablet by mouth daily for 30 doses. 08/19/21 09/18/21 Yes Stacy Deshler, Kermit Balo, MD  famotidine  (PEPCID) 40 MG tablet Take 1 tablet (40 mg total) by mouth daily for 7 days. Patient not taking: Reported on 08/19/2021 01/11/21 01/18/21  Jacklynn Bue, MD  ferrous sulfate 325 (65 FE) MG EC tablet Take 1 tablet (325 mg total) by mouth 2 (two) times daily. Patient not taking: Reported on 08/19/2021 11/28/20 11/28/21  Cipriano Bunker, MD    Allergies    Patient has no known allergies.  Review of Systems   Review of Systems  Constitutional:  Negative for chills and fever.  HENT:  Negative for ear pain and sore throat.   Eyes:  Negative for pain and visual disturbance.  Respiratory:  Positive for shortness of breath. Negative for cough.   Cardiovascular:  Positive for chest pain. Negative for palpitations.  Gastrointestinal:  Negative for abdominal pain and vomiting.  Genitourinary:  Negative for dysuria and vaginal bleeding.  Musculoskeletal:  Negative for arthralgias and back pain.  Skin:  Negative for color change and rash.  Neurological:  Positive for light-headedness. Negative for syncope.  All other systems reviewed and are negative.  Physical Exam Updated Vital Signs BP 128/79 (BP Location: Right Arm)    Pulse 65    Temp 97.9 F (36.6 C) (Oral)    Resp 18    SpO2 100%   Physical Exam Constitutional:      General: She is not in  acute distress. HENT:     Head: Normocephalic and atraumatic.  Eyes:     Conjunctiva/sclera: Conjunctivae normal.     Pupils: Pupils are equal, round, and reactive to light.  Cardiovascular:     Rate and Rhythm: Normal rate and regular rhythm.  Pulmonary:     Effort: Pulmonary effort is normal. No respiratory distress.  Abdominal:     General: There is no distension.     Tenderness: There is no abdominal tenderness.  Skin:    General: Skin is warm and dry.  Neurological:     General: No focal deficit present.     Mental Status: She is alert. Mental status is at baseline.  Psychiatric:        Mood and Affect: Mood normal.        Behavior:  Behavior normal.    ED Results / Procedures / Treatments   Labs (all labs ordered are listed, but only abnormal results are displayed) Labs Reviewed  COMPREHENSIVE METABOLIC PANEL - Abnormal; Notable for the following components:      Result Value   Calcium 8.8 (*)    All other components within normal limits  CBC WITH DIFFERENTIAL/PLATELET - Abnormal; Notable for the following components:   Hemoglobin 8.3 (*)    HCT 28.7 (*)    MCV 68.0 (*)    MCH 19.7 (*)    MCHC 28.9 (*)    RDW 21.2 (*)    All other components within normal limits  IRON AND TIBC - Abnormal; Notable for the following components:   Iron 24 (*)    TIBC 497 (*)    Saturation Ratios 5 (*)    All other components within normal limits  FERRITIN - Abnormal; Notable for the following components:   Ferritin 3 (*)    All other components within normal limits  RETICULOCYTES - Abnormal; Notable for the following components:   Immature Retic Fract 36.2 (*)    All other components within normal limits  RESP PANEL BY RT-PCR (FLU A&B, COVID) ARPGX2  VITAMIN B12  FOLATE  I-STAT BETA HCG BLOOD, ED ([facility], [facility], [facility] ONLY)  TYPE AND SCREEN  PREPARE RBC (CROSSMATCH)    EKG None  Radiology No results found.  Procedures .Critical Care Performed by: Wyvonnia Dusky, MD Authorized by: Wyvonnia Dusky, MD   Critical care provider statement:    Critical care time (minutes):  45   Critical care time was exclusive of:  Separately billable procedures and treating other patients   Critical care was necessary to treat or prevent imminent or life-threatening deterioration of the following conditions:  Circulatory failure   Critical care was time spent personally by me on the following activities:  Ordering and performing treatments and interventions, ordering and review of laboratory studies, ordering and review of radiographic studies, pulse oximetry, review of old charts, examination of patient and evaluation of patient's  response to treatment Comments:     Blood transfusion, symptomatic anemia   Medications Ordered in ED Medications  0.9 %  sodium chloride infusion (has no administration in time range)  sodium chloride flush (NS) 0.9 % injection 10-40 mL (has no administration in time range)  sodium chloride flush (NS) 0.9 % injection 10-40 mL (has no administration in time range)    ED Course  I have reviewed the triage vital signs and the nursing notes.  Pertinent labs & imaging results that were available during my care of the patient were reviewed by me and considered  in my medical decision making (see chart for details).  Patient is here complaining of symptoms of anemia, which she has had recurring in the past, likely related to heavy menstrual periods.  This is likely iron deficiency anemia.  I had a conversation with her about the importance of getting on iron, which she promises to do moving forward.  I also advised the importance of following up with her gynecologist to see if there may be hormonal therapy or additional medications that can help with her menses.  She does feel that she needs a blood transfusion, her hemoglobin is 8.3, unclear which direction it is trending.  Her vital signs are otherwise stable.  Given that she is lightheaded and having the symptoms, I discussed the risk and benefits of a transfusion, we have agreed to give her 1 unit of blood here in the ER.  She was consented for transfusion.  Otherwise her labs are largely unremarkable.  I doubt GI hemorrhage.  I doubt infection.  This seems extremely atypical for ACS or pulmonary embolism.  Her chest discomfort is a symptom she has had in the past which resolved after transfusion.  Clinical Course as of 08/20/21 0010  Thu Aug 19, 2021  2030 Pt completed 1/2 unit of blood and line infiltrated.  She asks about going home; I think this is reasonable, given how extraordinarily difficult it has been to obtain IV access.  Her VS have been  stable, and she completed half a unit pRBC here.  Transfusion discontinued, okay for discharge. [MT]  2304 Transfusion started [MT]  2341 Covid/flu negative [MT]    Clinical Course User Index [MT] Wyvonnia Dusky, MD    Final Clinical Impression(s) / ED Diagnoses Final diagnoses:  Symptomatic anemia    Rx / DC Orders ED Discharge Orders          Ordered    senna-docusate (SENOKOT-S) 8.6-50 MG tablet  Daily        08/19/21 2305             Wyvonnia Dusky, MD 08/20/21 0010

## 2021-08-19 NOTE — ED Triage Notes (Signed)
Pt bib ems from home with lightheaded X1 week and CP onset today. Pt called by PCP today and was told her blood count was low. 12 lead unremarkable. VSS with ems. 18g RAC.

## 2021-08-20 LAB — TYPE AND SCREEN
ABO/RH(D): O POS
Antibody Screen: NEGATIVE
Unit division: 0

## 2021-08-20 LAB — BPAM RBC
Blood Product Expiration Date: 202212182359
ISSUE DATE / TIME: 202212152257
Unit Type and Rh: 5100

## 2021-08-20 NOTE — ED Notes (Signed)
Pt called RN to room for painful IV site. Pt's blood tranfusion noted to be infiltrated and infusion stopped immediately.  EDP and IV team consulted.  EDP talked with pt and elected to complete the tranfusion with the estimated amount received at 200 mL.  Midline removed, coban and 4 x 4 applied.  Pt advised to use ice and tylenol if painful.  Pt voiced understanding.

## 2022-10-07 IMAGING — CT CT ABD-PELV W/ CM
2 of 5 series · 16 of 46 positions shown, 18 images · IV contrast (omnipaque)
Comparison: None.

CLINICAL DATA: Abdominal pain and nausea

EXAM:
CT ABDOMEN AND PELVIS WITH CONTRAST
TECHNIQUE: Multidetector CT imaging of the abdomen and pelvis was performed
using the standard protocol following bolus administration of
intravenous contrast. Oral contrast was also administered.
CONTRAST:  100mL OMNIPAQUE IOHEXOL 300 MG/ML  SOLN

[Series 3: abdomen 5.0 · axial · 0.98mm/px · z∈[-383,+7]mm · 13 of 92 slices shown, 15 images]
[im 7/92  soft-tissue]
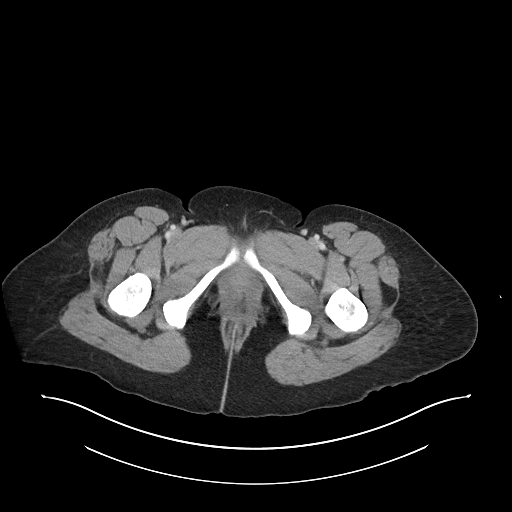
[im 7/92  bone]
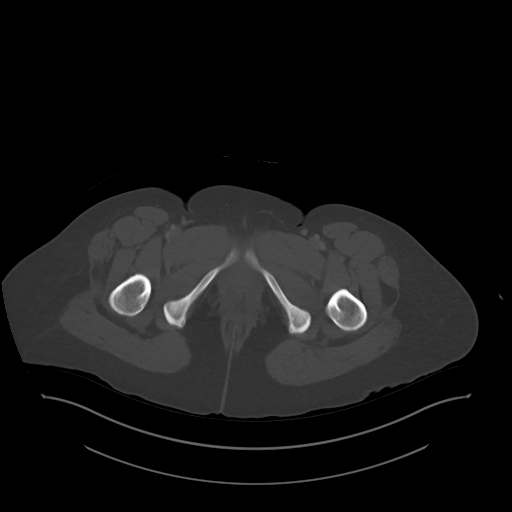
[im 13/92  soft-tissue]
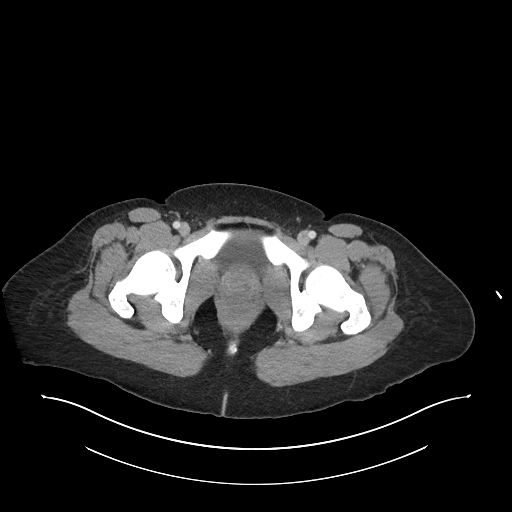
[im 19/92  soft-tissue]
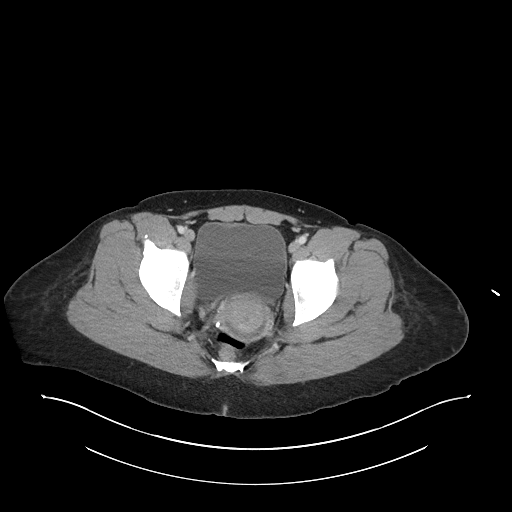
[im 25/92  soft-tissue]
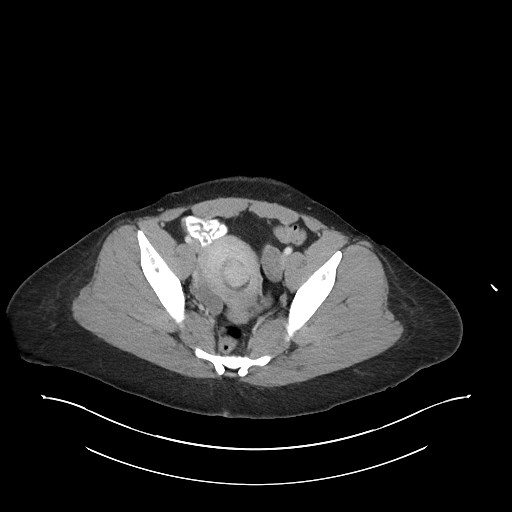
[im 31/92  soft-tissue]
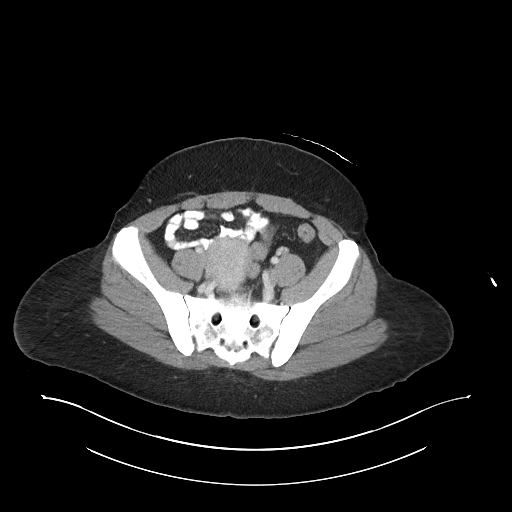
[im 37/92  soft-tissue]
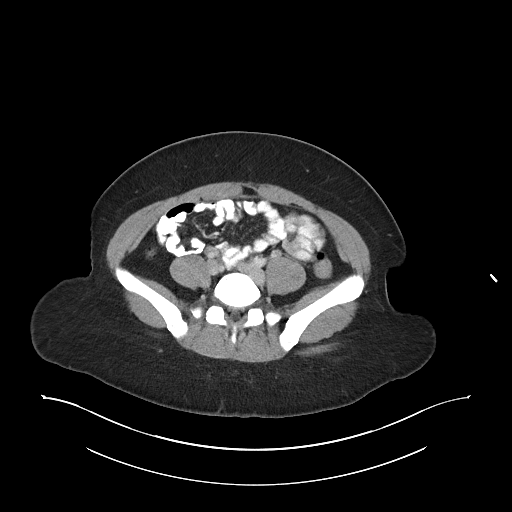
[im 49/92  soft-tissue]
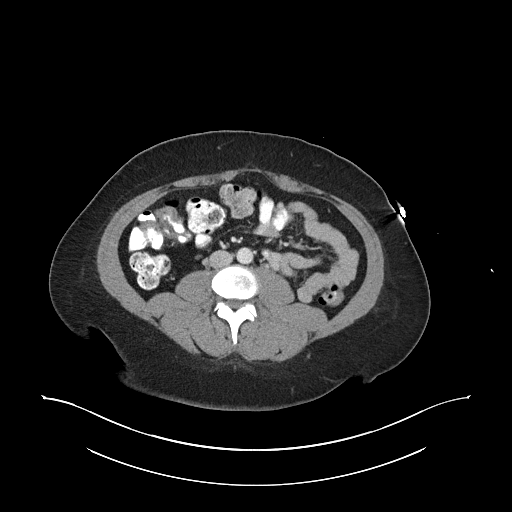
[im 55/92  soft-tissue]
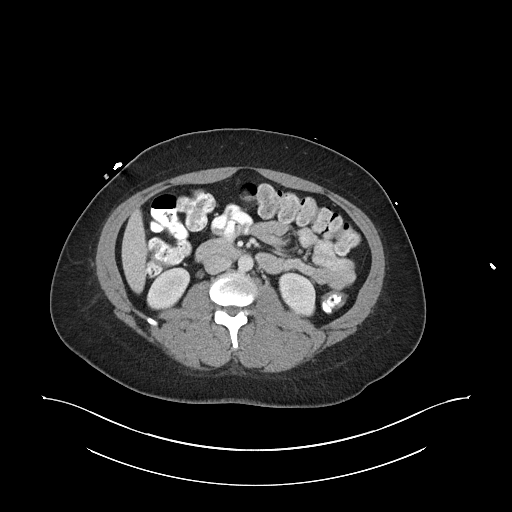
[im 61/92  soft-tissue]
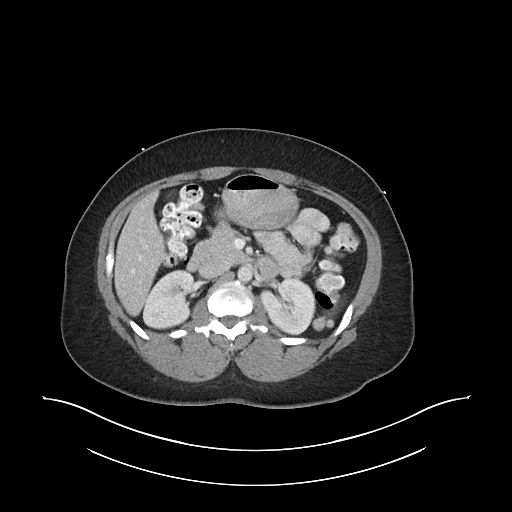
[im 61/92  bone]
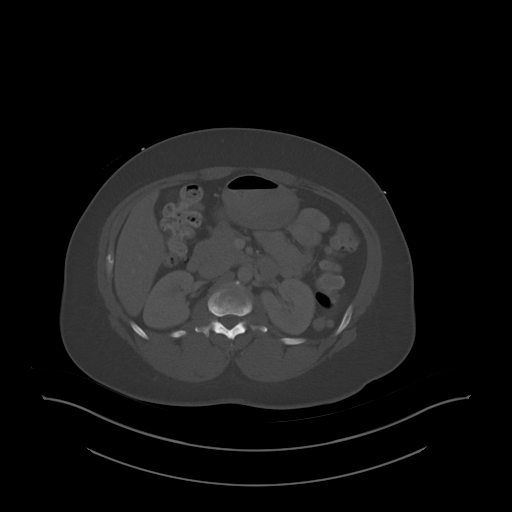
[im 67/92  soft-tissue]
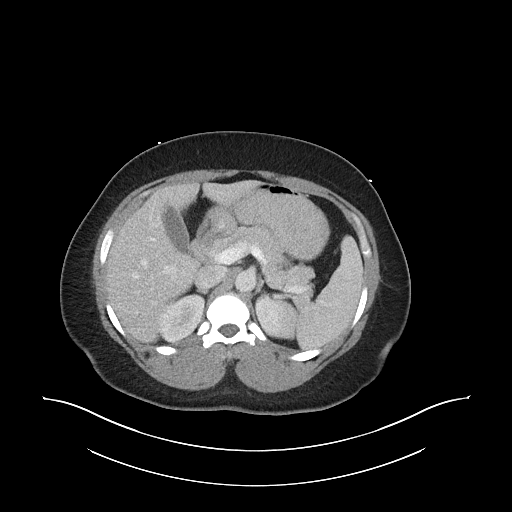
[im 73/92  soft-tissue]
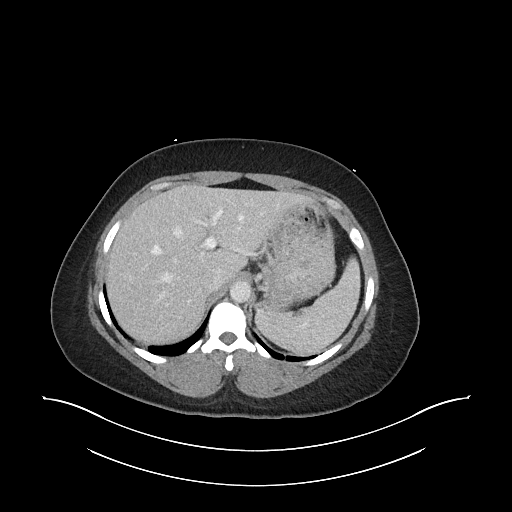
[im 79/92  soft-tissue]
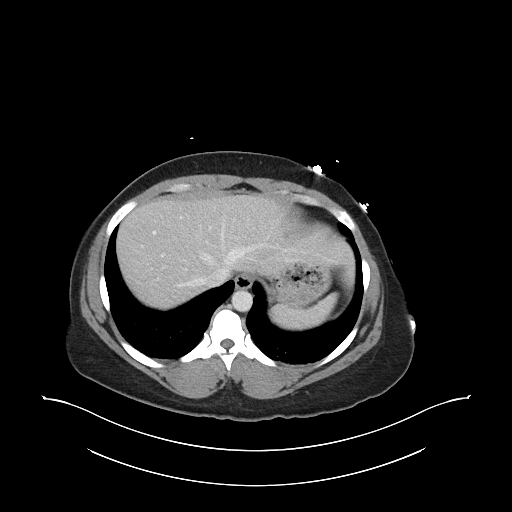
[im 85/92  soft-tissue]
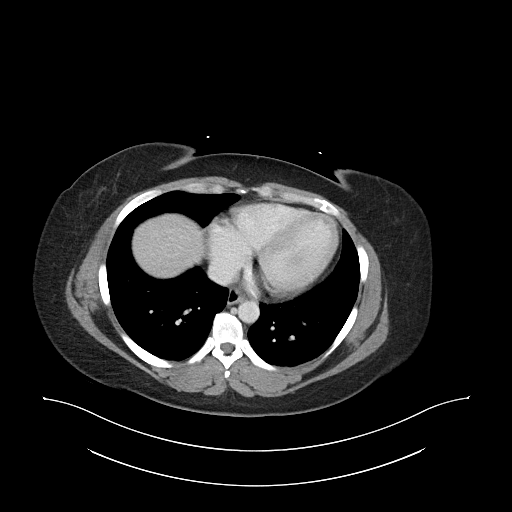

[Series 6: abdomen 3.0 mpr cor · coronal · 0.90mm/px · 3 of 100 slices shown]
[im 34/100  soft-tissue]
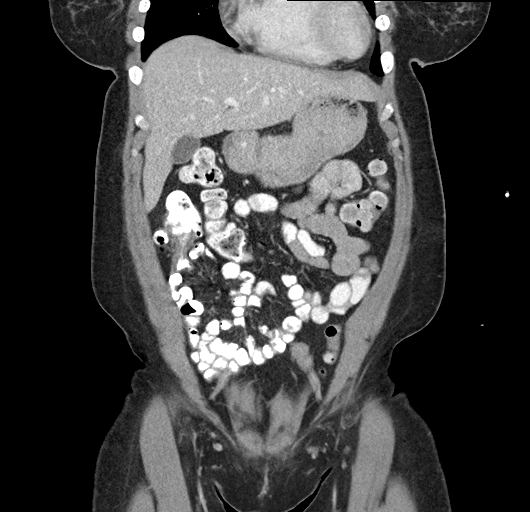
[im 45/100  soft-tissue]
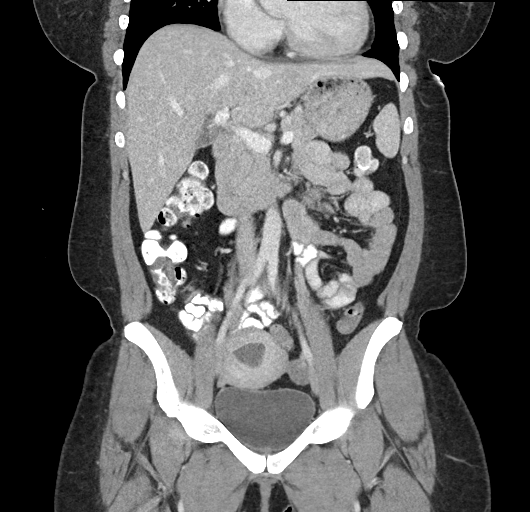
[im 56/100  soft-tissue]
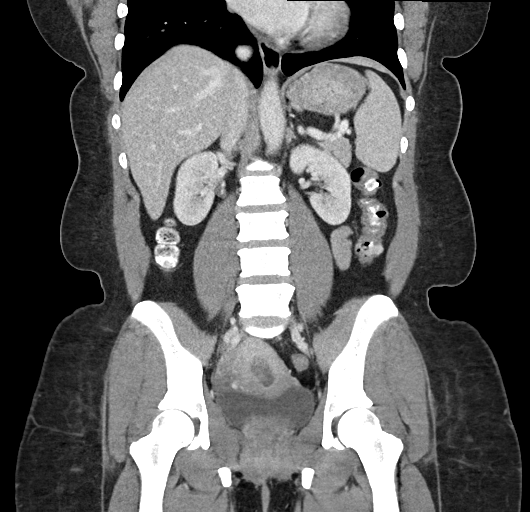

[16 of 46 positions shown; findings below may reference images not displayed]

FINDINGS: Lower chest: There is mild bibasilar atelectasis. There is no edema
or airspace opacity.

Hepatobiliary: No focal liver lesions are evident. Gallbladder wall
is not appreciably thickened. There is no biliary duct dilatation.

Pancreas: There is no pancreatic mass or inflammatory focus.

Spleen: No splenic lesions are evident. Small splenules inferior to
the spleen noted.

Adrenals/Urinary Tract: Adrenals bilaterally appear normal. There is
no evident renal mass or hydronephrosis on either side. There is no
renal or ureteral calculus on either side. Urinary bladder is
midline with wall thickness within normal limits.

Stomach/Bowel: There is no appreciable bowel wall or mesenteric
thickening. There is no demonstrable bowel obstruction. The terminal
ileum appears unremarkable. No periappendiceal region inflammatory
change. No free air or portal venous air.

Vascular/Lymphatic: No abdominal aortic aneurysm. No arterial
vascular lesions are evident. Major venous structures appear patent.
There is no evident adenopathy in the abdomen or pelvis.

Reproductive: The uterus is anteverted. The endometrium is thickened
with an apparent mass within the endometrium measuring 2.7 x 2.7 x
2.8 cm. Fluid tracks throughout the cervix as well. No adnexal
masses are evident.

Other: No appreciable ascites or abscess in the abdomen or pelvis.
There is a small umbilical hernia containing fat but no bowel.

Musculoskeletal: No blastic or lytic bone lesions. Small bone island
in the T12 vertebral body. No intramuscular lesions.
IMPRESSION: 1. Thickened endometrium with an apparent mass within the
endometrium measuring 2.7 x 2.7 x 2.8 cm. There is also fluid
throughout the cervix. Pelvic ultrasound advised to further
evaluate.

2. No evident bowel wall thickening or bowel obstruction. No abscess
in the abdomen or pelvis. Appendix appears normal.

3. No evident renal or ureteral calculus. No hydronephrosis. Urinary
bladder wall thickness is within normal limits.

These results will be called to the ordering clinician or
representative by the Radiologist Assistant, and communication
documented in the PACS or [REDACTED].

## 2022-10-16 ENCOUNTER — Other Ambulatory Visit: Payer: Self-pay

## 2022-10-16 ENCOUNTER — Emergency Department (HOSPITAL_BASED_OUTPATIENT_CLINIC_OR_DEPARTMENT_OTHER)
Admission: EM | Admit: 2022-10-16 | Discharge: 2022-10-16 | Disposition: A | Discharge status: Home / Self Care | Payer: Self-pay | Attending: Emergency Medicine | Admitting: Emergency Medicine

## 2022-10-16 ENCOUNTER — Encounter (HOSPITAL_BASED_OUTPATIENT_CLINIC_OR_DEPARTMENT_OTHER): Payer: Self-pay | Admitting: Emergency Medicine

## 2022-10-16 DIAGNOSIS — K219 Gastro-esophageal reflux disease without esophagitis: Secondary | ICD-10-CM | POA: Insufficient documentation

## 2022-10-16 DIAGNOSIS — D509 Iron deficiency anemia, unspecified: Secondary | ICD-10-CM | POA: Insufficient documentation

## 2022-10-16 DIAGNOSIS — K2960 Other gastritis without bleeding: Secondary | ICD-10-CM

## 2022-10-16 HISTORY — DX: Anemia, unspecified: D64.9

## 2022-10-16 LAB — CBC WITH DIFFERENTIAL/PLATELET
Abs Immature Granulocytes: 0.02 10*3/uL (ref 0.00–0.07)
Basophils Absolute: 0.1 10*3/uL (ref 0.0–0.1)
Basophils Relative: 1 %
Eosinophils Absolute: 0.1 10*3/uL (ref 0.0–0.5)
Eosinophils Relative: 2 %
HCT: 28.1 % — ABNORMAL LOW (ref 36.0–46.0)
Hemoglobin: 8.1 g/dL — ABNORMAL LOW (ref 12.0–15.0)
Immature Granulocytes: 0 %
Lymphocytes Relative: 32 %
Lymphs Abs: 1.5 10*3/uL (ref 0.7–4.0)
MCH: 19 pg — ABNORMAL LOW (ref 26.0–34.0)
MCHC: 28.8 g/dL — ABNORMAL LOW (ref 30.0–36.0)
MCV: 65.8 fL — ABNORMAL LOW (ref 80.0–100.0)
Monocytes Absolute: 0.3 10*3/uL (ref 0.1–1.0)
Monocytes Relative: 5 %
Neutro Abs: 2.9 10*3/uL (ref 1.7–7.7)
Neutrophils Relative %: 60 %
Platelets: 424 10*3/uL — ABNORMAL HIGH (ref 150–400)
RBC: 4.27 MIL/uL (ref 3.87–5.11)
RDW: 20.5 % — ABNORMAL HIGH (ref 11.5–15.5)
WBC: 4.8 10*3/uL (ref 4.0–10.5)
nRBC: 0 % (ref 0.0–0.2)

## 2022-10-16 LAB — BASIC METABOLIC PANEL
Anion gap: 5 (ref 5–15)
BUN: 10 mg/dL (ref 6–20)
CO2: 22 mmol/L (ref 22–32)
Calcium: 8.5 mg/dL — ABNORMAL LOW (ref 8.9–10.3)
Chloride: 110 mmol/L (ref 98–111)
Creatinine, Ser: 0.88 mg/dL (ref 0.44–1.00)
GFR, Estimated: >60 mL/min (ref >60)
Glucose, Bld: 98 mg/dL (ref 70–99)
Potassium: 3.1 mmol/L — ABNORMAL LOW (ref 3.5–5.1)
Sodium: 137 mmol/L (ref 135–145)

## 2022-10-16 LAB — PREGNANCY, URINE: Preg Test, Ur: NEGATIVE

## 2022-10-16 MED ORDER — POTASSIUM CHLORIDE ER 10 MEQ PO TBCR: 10.0000 meq | EXTENDED_RELEASE_TABLET | Freq: Every day | ORAL | 0 refills | Status: DC

## 2022-10-16 MED ORDER — FAMOTIDINE IN NACL 20-0.9 MG/50ML-% IV SOLN
20.0000 mg | Freq: Once | INTRAVENOUS | Status: AC
  Administered 2022-10-16: 20 mg via INTRAVENOUS
  Filled 2022-10-16: qty 50

## 2022-10-16 MED ORDER — SODIUM CHLORIDE 0.9 % IV BOLUS
1000.0000 mL | Freq: Once | INTRAVENOUS | Status: AC
  Administered 2022-10-16: 1000 mL via INTRAVENOUS

## 2022-10-16 MED ORDER — ONDANSETRON HCL 4 MG/2ML IJ SOLN
4.0000 mg | Freq: Once | INTRAMUSCULAR | Status: AC
  Administered 2022-10-16: 4 mg via INTRAVENOUS
  Filled 2022-10-16: qty 2

## 2022-10-16 MED ORDER — OMEPRAZOLE 20 MG PO CPDR: 20.0000 mg | DELAYED_RELEASE_CAPSULE | Freq: Every day | ORAL | 2 refills | Status: DC

## 2022-10-16 MED ORDER — FAMOTIDINE 20 MG PO TABS: 20.0000 mg | ORAL_TABLET | Freq: Two times a day (BID) | ORAL | 2 refills | Status: DC

## 2022-10-16 NOTE — ED Provider Notes (Signed)
[facility] Provider Note   CSN: [id number] Arrival date & time: 10/16/22  0806     History  Chief Complaint  Patient presents with   Hematemesis    Kelli Brooks is a 42 y.o. female with a history of heavy and irregular menstrual cycles, anemia, presenting to the emergency department with complaint of hematemesis.  Patient reports that she woke up this morning due to a significant coughing spell and then had several episodes of vomiting where she noted there was blood flecked into her vomit.  [name] this happens to her occasionally, typically in the early morning or late at night, when she often is coughing the triggers vomiting.  She does suffer from reflux, does not take any medications for it, tends to snack late at night before bedtime.  She also reports that she has been experiencing some lightheadedness and a mild headache, and says this feels similar to when she had symptomatic anemia in the past, requiring blood transfusions.  She says she was told that her anemia was related to heavy menstrual cycles, she is currently on her menstrual cycle.  She cycles occur about once a month, and can last a few days to a week.  She does not take any medication to regulate them but does not see an OB/GYN doctor.  She does however take iron tablets for her anemia.  HPI     Home Medications Prior to Admission medications   Medication Sig Start Date End Date Taking? Authorizing Provider  famotidine (PEPCID) 20 MG tablet Take 1 tablet (20 mg total) by mouth 2 (two) times daily. Take 30 minutes before breakfast and dinner 10/16/22 11/15/22 Yes Shereese Bonnie, Carola Rhine, MD  omeprazole (PRILOSEC) 20 MG capsule Take 1 capsule (20 mg total) by mouth daily. 10/16/22 11/15/22 Yes Naryiah Schley, Carola Rhine, MD  potassium chloride (KLOR-CON) 10 MEQ tablet Take 1 tablet (10 mEq total) by mouth daily for 15 days. 10/16/22 10/31/22 Yes Glennon Kopko, Carola Rhine, MD  famotidine (PEPCID) 40 MG  tablet Take 1 tablet (40 mg total) by mouth daily for 7 days. Patient not taking: Reported on 08/19/2021 01/11/21 01/18/21  Aris Lot, MD  ferrous sulfate 325 (65 FE) MG EC tablet Take 1 tablet (325 mg total) by mouth 2 (two) times daily. Patient not taking: Reported on 08/19/2021 11/28/20 11/28/21  Shawna Clamp, MD      Allergies    Patient has no known allergies.    Review of Systems   Review of Systems  Physical Exam Updated Vital Signs BP 138/77   Pulse 78   Temp 98.3 F (36.8 C) (Oral)   Resp 16   SpO2 96%  Physical Exam Constitutional:      General: She is not in acute distress. HENT:     Head: Normocephalic and atraumatic.  Eyes:     Conjunctiva/sclera: Conjunctivae normal.     Pupils: Pupils are equal, round, and reactive to light.  Cardiovascular:     Rate and Rhythm: Normal rate and regular rhythm.  Pulmonary:     Effort: Pulmonary effort is normal. No respiratory distress.  Abdominal:     General: There is no distension.     Tenderness: There is no abdominal tenderness.  Skin:    General: Skin is warm and dry.  Neurological:     General: No focal deficit present.     Mental Status: She is alert. Mental status is at baseline.  Psychiatric:  Mood and Affect: Mood normal.        Behavior: Behavior normal.     ED Results / Procedures / Treatments   Labs (all labs ordered are listed, but only abnormal results are displayed) Labs Reviewed  BASIC METABOLIC PANEL - Abnormal; Notable for the following components:      Result Value   Potassium 3.1 (*)    Calcium 8.5 (*)    All other components within normal limits  CBC WITH DIFFERENTIAL/PLATELET - Abnormal; Notable for the following components:   Hemoglobin 8.1 (*)    HCT 28.1 (*)    MCV 65.8 (*)    MCH 19.0 (*)    MCHC 28.8 (*)    RDW 20.5 (*)    Platelets 424 (*)    All other components within normal limits  PREGNANCY, URINE    EKG None  Radiology No results  found.  Procedures Procedures    Medications Ordered in ED Medications  sodium chloride 0.9 % bolus 1,000 mL (0 mLs Intravenous Stopped 10/16/22 1020)  famotidine (PEPCID) IVPB 20 mg premix (0 mg Intravenous Stopped 10/16/22 0956)  ondansetron (ZOFRAN) injection 4 mg (4 mg Intravenous Given 10/16/22 [id number])    ED Course/ Medical Decision Making/ A&P Clinical Course as of 10/16/22 1026  Sun Oct 16, 2022  0852 Hgb 5.8 on admission to hospital in 2022 for symptomatic anemia per external chart review [MT]    Clinical Course User Index [MT] Wyvonnia Dusky, MD                             Medical Decision Making Amount and/or Complexity of Data Reviewed Labs: ordered.  Risk Prescription drug management.   This patient presents to the ED with concern for hematemesis, nausea vomiting, lightheadedness. This involves an extensive number of treatment options, and is a complaint that carries with it a high risk of complications and morbidity.  The differential diagnosis includes viral illness versus reflux gastritis versus symptomatic anemia versus other  External records from outside source obtained and reviewed including recent hospitalization and 2022 for microcytic iron deficiency anemia, with hemoglobin 5.3 at the time requiring blood transfusion  I ordered and personally interpreted labs.  The pertinent results include: Hemoglobin of chronic microcytic anemia but stable and near recent baseline levels.  I ordered medication including IV Pepcid, fluids and Zofran for nausea, gastritis and hydration  I have reviewed the patients home medicines and have made adjustments as needed  Test Considered: Low suspicion for acute PE, and I did not see an indication for CT angiogram imaging   After the interventions noted above, I reevaluated the patient and found that they have: improved  Social Determinants of Health: we discussed lifestyle and dietary changes to improve suspected reflux  symptoms.  Dispostion:  After consideration of the diagnostic results and the patients response to treatment, I feel that the patent would benefit from outpatient follow-up with OB/GYN for her heavy menstrual cycles and chronic anemia, as well as gastroenterology for suspected persistent or severe reflux symptoms.         Final Clinical Impression(s) / ED Diagnoses Final diagnoses:  Microcytic anemia  Reflux gastritis    Rx / DC Orders ED Discharge Orders          Ordered    famotidine (PEPCID) 20 MG tablet  2 times daily        10/16/22 1008    omeprazole (PRILOSEC) 20  MG capsule  Daily        10/16/22 1008    potassium chloride (KLOR-CON) 10 MEQ tablet  Daily        10/16/22 1008              Wyvonnia Dusky, MD 10/16/22 1028

## 2022-10-16 NOTE — ED Notes (Signed)
ED Provider at bedside. 

## 2022-10-16 NOTE — ED Triage Notes (Signed)
Pt reports she was having emesis with blood in it that starts this morning. Has had emesis with blood before but normally clears up as she throws up. Has had this issue for a year. Denies diarrhea, abd pain, sore throat, or coughing up blood.

## 2022-10-16 NOTE — Discharge Instructions (Addendum)
Please try not to eat 3 hours before bedtime to help prevent or reduce your acid reflux at night.  You can drink water before bedtime.  Try to avoid any carbonated or caffeinated beverages 3 hours before bedtime.

## 2023-01-26 NOTE — Progress Notes (Signed)
 Urine specimen was not enough to temp they were offered water and to wait they declined both and choose to leave stating that they would contact the employer and get another code to come back and do the exam again they wanted to come back tomorrow and try again

## 2023-05-08 ENCOUNTER — Other Ambulatory Visit: Payer: Self-pay

## 2023-05-08 ENCOUNTER — Emergency Department (HOSPITAL_BASED_OUTPATIENT_CLINIC_OR_DEPARTMENT_OTHER)
Admission: EM | Admit: 2023-05-08 | Discharge: 2023-05-08 | Disposition: A | Discharge status: Home / Self Care | Payer: Self-pay | Attending: Emergency Medicine | Admitting: Emergency Medicine

## 2023-05-08 ENCOUNTER — Encounter (HOSPITAL_BASED_OUTPATIENT_CLINIC_OR_DEPARTMENT_OTHER): Payer: Self-pay | Admitting: Emergency Medicine

## 2023-05-08 DIAGNOSIS — R21 Rash and other nonspecific skin eruption: Secondary | ICD-10-CM | POA: Insufficient documentation

## 2023-05-08 MED ORDER — HYDROCORTISONE 1 % EX CREA
TOPICAL_CREAM | Freq: Once | CUTANEOUS | Status: AC
Start: 2023-05-08 — End: 2023-05-08
  Filled 2023-05-08: qty 28

## 2023-05-08 MED ORDER — DIPHENHYDRAMINE HCL 25 MG PO CAPS
25.0000 mg | ORAL_CAPSULE | Freq: Once | ORAL | Status: AC
Start: 2023-05-08 — End: 2023-05-08
  Administered 2023-05-08: 25 mg via ORAL
  Filled 2023-05-08: qty 1

## 2023-05-08 NOTE — ED Provider Notes (Signed)
[facility] Provider Note   CSN: [id number] Arrival date & time: 05/08/23  0045     History  Chief Complaint  Patient presents with   Rash    Kelli Brooks is a 42 y.o. female.  42 year old female who presents ER today secondary to multiple bumps on bilateral arms and left upper leg.  Patient states that started just prior to arrival.  Some of admit some do not.  She does not know where they came from.  She states that they look insect bites but she has not been exposed any insects that she knows of.  No known allergens.  No chest pain, shortness of breath, nausea, lightheadedness or other allergic reactions.  States she has had to deal with bedbugs before and this is nothing like that.   Rash      Home Medications Prior to Admission medications   Medication Sig Start Date End Date Taking? Authorizing Provider  famotidine (PEPCID) 20 MG tablet Take 1 tablet (20 mg total) by mouth 2 (two) times daily. Take 30 minutes before breakfast and dinner 10/16/22 11/15/22  Terald Sleeper, MD  famotidine (PEPCID) 40 MG tablet Take 1 tablet (40 mg total) by mouth daily for 7 days. Patient not taking: Reported on 08/19/2021 01/11/21 01/18/21  Jacklynn Bue, MD  ferrous sulfate 325 (65 FE) MG EC tablet Take 1 tablet (325 mg total) by mouth 2 (two) times daily. Patient not taking: Reported on 08/19/2021 11/28/20 11/28/21  Willeen Niece, MD  omeprazole (PRILOSEC) 20 MG capsule Take 1 capsule (20 mg total) by mouth daily. 10/16/22 11/15/22  Terald Sleeper, MD  potassium chloride (KLOR-CON) 10 MEQ tablet Take 1 tablet (10 mEq total) by mouth daily for 15 days. 10/16/22 10/31/22  Terald Sleeper, MD      Allergies    Patient has no known allergies.    Review of Systems   Review of Systems  Skin:  Positive for rash.    Physical Exam Updated Vital Signs BP (!) 144/116   Pulse (!) 109   Resp 16   Ht 5\' 2"  (1.575 m)   Wt 97.1 kg   LMP  04/24/2023   SpO2 99%   BMI 39.14 kg/m  Physical Exam Vitals and nursing note reviewed.  Constitutional:      Appearance: She is well-developed.  HENT:     Head: Normocephalic and atraumatic.  Cardiovascular:     Rate and Rhythm: Normal rate and regular rhythm.  Pulmonary:     Effort: No respiratory distress.     Breath sounds: No stridor.  Abdominal:     General: There is no distension.  Musculoskeletal:     Cervical back: Normal range of motion.  Skin:    Comments: Multiple small papules to volar surface of her forearms bilaterally without erythema, vesicles or ulceration.  Neurological:     Mental Status: She is alert.     ED Results / Procedures / Treatments   Labs (all labs ordered are listed, but only abnormal results are displayed) Labs Reviewed - No data to display  EKG None  Radiology No results found.  Procedures Procedures    Medications Ordered in ED Medications  diphenhydrAMINE (BENADRYL) capsule 25 mg (25 mg Oral Given 05/08/23 0137)  hydrocortisone cream 1 % ( Topical Given 05/08/23 0137)    ED Course/ Medical Decision Making/ A&P  Medical Decision Making  Really look like insect bites.  However it does not itch which may kilobit less likely there is no erythema she has not been exposed to anything that she knows of.  Is not seem like hives or urticaria to suggest that is the cause.  It is not consistent with any other emergent types of rashes such as petechiae or purpura.  No evidence of Stevens-Johnson's.  Will treat symptomatically and have her follow-up with PCP if not improving. Here if any s/s of worsening or anaphylaxis.   Final Clinical Impression(s) / ED Diagnoses Final diagnoses:  Rash    Rx / DC Orders ED Discharge Orders     None         Cleva Camero, Barbara Cower, MD 05/08/23 [phone]

## 2023-05-08 NOTE — ED Triage Notes (Signed)
Pt reports noticing "rashes" on her anterior forearms just PTA. Denies new soap, lotions, detergents or cleaning products. Denies any other symptoms. Reports rash is not itchy.

## 2023-05-10 ENCOUNTER — Encounter (HOSPITAL_BASED_OUTPATIENT_CLINIC_OR_DEPARTMENT_OTHER): Payer: Self-pay | Admitting: Pediatrics

## 2023-05-10 ENCOUNTER — Emergency Department (HOSPITAL_BASED_OUTPATIENT_CLINIC_OR_DEPARTMENT_OTHER)
Admission: EM | Admit: 2023-05-10 | Discharge: 2023-05-10 | Disposition: A | Discharge status: Home / Self Care | Payer: Self-pay | Attending: Emergency Medicine | Admitting: Emergency Medicine

## 2023-05-10 ENCOUNTER — Other Ambulatory Visit: Payer: Self-pay

## 2023-05-10 DIAGNOSIS — R21 Rash and other nonspecific skin eruption: Secondary | ICD-10-CM | POA: Insufficient documentation

## 2023-05-10 MED ORDER — PERMETHRIN 5 % EX CREA
TOPICAL_CREAM | CUTANEOUS | 0 refills | Status: AC
Start: 2023-05-10 — End: ?

## 2023-05-10 NOTE — ED Provider Notes (Signed)
[facility] Provider Note   CSN: [id number] Arrival date & time: 05/10/23  1727     History  Chief Complaint  Patient presents with   Rash    Kelli Brooks is a 42 y.o. female.  Patient here for reevaluation of rash.  She is having some itching in her legs and arms.  Thought it was may be insect bites.  But she wanted reevaluation.  She does not have a primary care doctor.  Nothing makes it worse or better.  Denies any difficulty eating or drinking.  Denies any other known exposure.  No new medications.  The history is provided by the patient.       Home Medications Prior to Admission medications   Medication Sig Start Date End Date Taking? Authorizing Provider  permethrin (ELIMITE) 5 % cream Apply to affected area once 05/10/23  Yes Dorissa Stinnette, DO  famotidine (PEPCID) 20 MG tablet Take 1 tablet (20 mg total) by mouth 2 (two) times daily. Take 30 minutes before breakfast and dinner 10/16/22 11/15/22  Terald Sleeper, MD  famotidine (PEPCID) 40 MG tablet Take 1 tablet (40 mg total) by mouth daily for 7 days. Patient not taking: Reported on 08/19/2021 01/11/21 01/18/21  Jacklynn Bue, MD  ferrous sulfate 325 (65 FE) MG EC tablet Take 1 tablet (325 mg total) by mouth 2 (two) times daily. Patient not taking: Reported on 08/19/2021 11/28/20 11/28/21  Willeen Niece, MD  omeprazole (PRILOSEC) 20 MG capsule Take 1 capsule (20 mg total) by mouth daily. 10/16/22 11/15/22  Terald Sleeper, MD  potassium chloride (KLOR-CON) 10 MEQ tablet Take 1 tablet (10 mEq total) by mouth daily for 15 days. 10/16/22 10/31/22  Terald Sleeper, MD      Allergies    Patient has no known allergies.    Review of Systems   Review of Systems  Physical Exam Updated Vital Signs BP 128/84 (BP Location: Right Arm)   Pulse (!) 115   Temp 98 F (36.7 C)   Resp 18   Ht 5\' 2"  (1.575 m)   Wt 97.1 kg   LMP 04/24/2023   SpO2 96%   BMI 39.14 kg/m   Physical Exam Pulmonary:     Effort: Pulmonary effort is normal.  Skin:    Findings: Rash present.     Comments: She is got a few areas of may be small hives/mite/insect bites on leg, feels itchy between the webs of her hands  Neurological:     General: No focal deficit present.     Mental Status: She is alert.     ED Results / Procedures / Treatments   Labs (all labs ordered are listed, but only abnormal results are displayed) Labs Reviewed - No data to display  EKG None  Radiology No results found.  Procedures Procedures    Medications Ordered in ED Medications - No data to display  ED Course/ Medical Decision Making/ A&P                                 Medical Decision Making Risk Prescription drug management.   Kelli Brooks here for reevaluation of rash.  Unremarkable vitals.  No fever.  Prior ED doc thought may be insect bites and I still tend to favor this as well.  However could be scabies.  She is pretty itchy in between her fingers.  Scott small areas  of some raised bumps on the upper left thigh.  She is feeling itchy, throughout.  Overall conservatively will add permethrin but recommend continued use of Benadryl for itchiness.  I suspect that this will self resolve.  Given reassurance.  Discharged in good condition.  Have no concern for life emergent rash including Stevens-Johnson's syndrome or other concerning rash.  He understands return precautions.  This chart was dictated using voice recognition software.  Despite best efforts to proofread,  errors can occur which can change the documentation meaning.         Final Clinical Impression(s) / ED Diagnoses Final diagnoses:  Rash    Rx / DC Orders ED Discharge Orders          Ordered    permethrin (ELIMITE) 5 % cream        05/10/23 1825              Virgina Norfolk, DO 05/10/23 1827

## 2023-05-10 NOTE — ED Triage Notes (Signed)
Reported was seen Monday for generalized rash and was instructed to return if symptoms persist.

## 2023-05-23 ENCOUNTER — Emergency Department (HOSPITAL_BASED_OUTPATIENT_CLINIC_OR_DEPARTMENT_OTHER): Payer: Self-pay

## 2023-05-23 ENCOUNTER — Emergency Department (HOSPITAL_BASED_OUTPATIENT_CLINIC_OR_DEPARTMENT_OTHER)
Admission: EM | Admit: 2023-05-23 | Discharge: 2023-05-23 | Disposition: A | Discharge status: Home / Self Care | Payer: Self-pay | Attending: Emergency Medicine | Admitting: Emergency Medicine

## 2023-05-23 ENCOUNTER — Other Ambulatory Visit (HOSPITAL_BASED_OUTPATIENT_CLINIC_OR_DEPARTMENT_OTHER): Payer: Self-pay

## 2023-05-23 ENCOUNTER — Other Ambulatory Visit: Payer: Self-pay

## 2023-05-23 ENCOUNTER — Encounter (HOSPITAL_BASED_OUTPATIENT_CLINIC_OR_DEPARTMENT_OTHER): Payer: Self-pay

## 2023-05-23 DIAGNOSIS — U071 COVID-19: Secondary | ICD-10-CM

## 2023-05-23 LAB — SARS CORONAVIRUS 2 BY RT PCR: SARS Coronavirus 2 by RT PCR: POSITIVE — AB

## 2023-05-23 LAB — BASIC METABOLIC PANEL
Anion gap: 8 (ref 5–15)
BUN: 8 mg/dL (ref 6–20)
CO2: 22 mmol/L (ref 22–32)
Calcium: 8.4 mg/dL — ABNORMAL LOW (ref 8.9–10.3)
Chloride: 107 mmol/L (ref 98–111)
Creatinine, Ser: 0.97 mg/dL (ref 0.44–1.00)
GFR, Estimated: >60 mL/min (ref >60)
Glucose, Bld: 102 mg/dL — ABNORMAL HIGH (ref 70–99)
Potassium: 3.2 mmol/L — ABNORMAL LOW (ref 3.5–5.1)
Sodium: 137 mmol/L (ref 135–145)

## 2023-05-23 MED ORDER — PAXLOVID (300/100) 20 X 150 MG & 10 X 100MG PO TBPK
3.0000 | ORAL_TABLET | Freq: Two times a day (BID) | ORAL | 0 refills | Status: AC
  Filled 2023-05-23: qty 30, 5d supply, fill #0

## 2023-05-23 NOTE — ED Triage Notes (Signed)
Pt reports chest pain, SHOB, N/V, throat pain and coughing that started last night. Pt states she tested positive for covid at home.

## 2023-05-23 NOTE — ED Provider Notes (Addendum)
[facility] Provider Note   CSN: [id number] Arrival date & time: 05/23/23  2595     History  Chief Complaint  Patient presents with   Cough    Kelli Brooks is a 42 y.o. female.  Patient with onset of flulike symptoms yesterday.  Patient complaining of generalized weakness cough feeling a little shortness of breath some chest discomfort with the cough.  Some nausea vomiting no diarrhea.  Patient did a home COVID test that was positive.  Past medical history is just significant for anemia.  Patient's never used tobacco products.  In addition patient is complaining of sore throat.       Home Medications Prior to Admission medications   Medication Sig Start Date End Date Taking? Authorizing Provider  famotidine (PEPCID) 20 MG tablet Take 1 tablet (20 mg total) by mouth 2 (two) times daily. Take 30 minutes before breakfast and dinner 10/16/22 11/15/22  Terald Sleeper, MD  famotidine (PEPCID) 40 MG tablet Take 1 tablet (40 mg total) by mouth daily for 7 days. Patient not taking: Reported on 08/19/2021 01/11/21 01/18/21  Jacklynn Bue, MD  ferrous sulfate 325 (65 FE) MG EC tablet Take 1 tablet (325 mg total) by mouth 2 (two) times daily. Patient not taking: Reported on 08/19/2021 11/28/20 11/28/21  Willeen Niece, MD  omeprazole (PRILOSEC) 20 MG capsule Take 1 capsule (20 mg total) by mouth daily. 10/16/22 11/15/22  Terald Sleeper, MD  permethrin (ELIMITE) 5 % cream Apply to affected area once 05/10/23   Curatolo, Adam, DO  potassium chloride (KLOR-CON) 10 MEQ tablet Take 1 tablet (10 mEq total) by mouth daily for 15 days. 10/16/22 10/31/22  Terald Sleeper, MD      Allergies    Patient has no known allergies.    Review of Systems   Review of Systems  Constitutional:  Positive for fatigue. Negative for chills and fever.  HENT:  Positive for congestion and sore throat. Negative for ear pain.   Eyes:  Negative for pain and visual  disturbance.  Respiratory:  Positive for cough and shortness of breath.   Cardiovascular:  Negative for chest pain and palpitations.  Gastrointestinal:  Positive for nausea and vomiting. Negative for abdominal pain.  Genitourinary:  Negative for dysuria and hematuria.  Musculoskeletal:  Negative for arthralgias and back pain.  Skin:  Negative for color change and rash.  Neurological:  Negative for seizures and syncope.  All other systems reviewed and are negative.   Physical Exam Updated Vital Signs BP (!) 156/98   Pulse 98   Temp 98.1 F (36.7 C) (Oral)   Resp 16   Ht 1.575 m (5\' 2" )   Wt 97.1 kg   LMP 04/24/2023   SpO2 100%   BMI 39.14 kg/m  Physical Exam Vitals and nursing note reviewed.  Constitutional:      General: She is not in acute distress.    Appearance: Normal appearance. She is well-developed. She is not ill-appearing.  HENT:     Head: Normocephalic and atraumatic.     Mouth/Throat:     Pharynx: Oropharynx is clear.  Eyes:     Conjunctiva/sclera: Conjunctivae normal.  Cardiovascular:     Rate and Rhythm: Normal rate and regular rhythm.     Heart sounds: No murmur heard. Pulmonary:     Effort: Pulmonary effort is normal. No respiratory distress.     Breath sounds: Normal breath sounds. No wheezing, rhonchi or rales.  Abdominal:  Palpations: Abdomen is soft.     Tenderness: There is no abdominal tenderness.  Musculoskeletal:        General: No swelling.     Cervical back: Normal range of motion and neck supple.  Skin:    General: Skin is warm and dry.     Capillary Refill: Capillary refill takes less than 2 seconds.  Neurological:     General: No focal deficit present.     Mental Status: She is alert and oriented to person, place, and time.  Psychiatric:        Mood and Affect: Mood normal.     ED Results / Procedures / Treatments   Labs (all labs ordered are listed, but only abnormal results are displayed) Labs Reviewed  SARS CORONAVIRUS 2  BY RT PCR - Abnormal; Notable for the following components:      Result Value   SARS Coronavirus 2 by RT PCR POSITIVE (*)    All other components within normal limits  BASIC METABOLIC PANEL    EKG EKG Interpretation Date/Time:  Tuesday May 23 2023 07:53:46 EDT Ventricular Rate:  107 PR Interval:  144 QRS Duration:  83 QT Interval:  323 QTC Calculation: 431 R Axis:   35  Text Interpretation: Sinus tachycardia Low voltage, precordial leads Borderline T abnormalities, diffuse leads Confirmed by Vanetta Mulders [phone]) on 05/23/2023 8:00:26 AM  Radiology DG Chest Port 1 View  Result Date: 05/23/2023 CLINICAL DATA:  COVID EXAM: PORTABLE CHEST 1 VIEW COMPARISON:  May 2022 FINDINGS: Heart size appears near the upper limit of normal. No pleural effusion. No pneumothorax. No mass or consolidation. No acute osseous abnormality. IMPRESSION: No acute findings in the chest. Electronically Signed   By: Olive Bass M.D.   On: 05/23/2023 08:17    Procedures Procedures    Medications Ordered in ED Medications - No data to display  ED Course/ Medical Decision Making/ A&P                                 Medical Decision Making Amount and/or Complexity of Data Reviewed Labs: ordered. Radiology: ordered.   Patient's oxygen saturation here is 99% on room air.  Patient a little tachycardic EKG shows sinus tachycardia.  Lungs are clear bilaterally.  Patient nontoxic no acute distress.  COVID test positive here chest x-ray reviewed by me without any acute infiltrates.  Patient is a candidate for Paxil bid did talk to her about it we will send it to the pharmacy here.  Not sure what the cost would be.  Patient also told to use over-the-counter cold and flu medicines.  Precautions provided.  Patient will be given a work note.  Will check renal function to verify what dose of Paxlovid patient will qualify for.  Renal functions normal.  Final Clinical Impression(s) / ED  Diagnoses Final diagnoses:  COVID    Rx / DC Orders ED Discharge Orders     None         Vanetta Mulders, MD 05/23/23 1914    Vanetta Mulders, MD 05/23/23 7829    Vanetta Mulders, MD 05/23/23 [phone]

## 2023-05-23 NOTE — Discharge Instructions (Addendum)
Take the Paxlovid as directed.  Work note provided to be out of work for 7 days.  Recommend over-the-counter cold and flu medicines as needed.  Return for any new or worse symptoms.

## 2023-05-31 ENCOUNTER — Other Ambulatory Visit: Payer: Self-pay

## 2023-05-31 ENCOUNTER — Encounter (HOSPITAL_COMMUNITY): Payer: Self-pay

## 2023-05-31 ENCOUNTER — Emergency Department (HOSPITAL_COMMUNITY): Payer: Self-pay

## 2023-05-31 ENCOUNTER — Emergency Department (HOSPITAL_COMMUNITY)
Admission: EM | Admit: 2023-05-31 | Discharge: 2023-05-31 | Disposition: A | Discharge status: Home / Self Care | Payer: Self-pay | Attending: Emergency Medicine | Admitting: Emergency Medicine

## 2023-05-31 DIAGNOSIS — D649 Anemia, unspecified: Secondary | ICD-10-CM | POA: Insufficient documentation

## 2023-05-31 DIAGNOSIS — U071 COVID-19: Secondary | ICD-10-CM | POA: Insufficient documentation

## 2023-05-31 LAB — CBC WITH DIFFERENTIAL/PLATELET
Abs Immature Granulocytes: 0.02 10*3/uL (ref 0.00–0.07)
Basophils Absolute: 0.1 10*3/uL (ref 0.0–0.1)
Basophils Relative: 1 %
Eosinophils Absolute: 0.1 10*3/uL (ref 0.0–0.5)
Eosinophils Relative: 1 %
HCT: 25.9 % — ABNORMAL LOW (ref 36.0–46.0)
Hemoglobin: 6.9 g/dL — CL (ref 12.0–15.0)
Immature Granulocytes: 0 %
Lymphocytes Relative: 22 %
Lymphs Abs: 1.3 10*3/uL (ref 0.7–4.0)
MCH: 16.7 pg — ABNORMAL LOW (ref 26.0–34.0)
MCHC: 26.6 g/dL — ABNORMAL LOW (ref 30.0–36.0)
MCV: 62.7 fL — ABNORMAL LOW (ref 80.0–100.0)
Monocytes Absolute: 0.3 10*3/uL (ref 0.1–1.0)
Monocytes Relative: 5 %
Neutro Abs: 4.3 10*3/uL (ref 1.7–7.7)
Neutrophils Relative %: 71 %
Platelets: 680 10*3/uL — ABNORMAL HIGH (ref 150–400)
RBC: 4.13 MIL/uL (ref 3.87–5.11)
RDW: 22.5 % — ABNORMAL HIGH (ref 11.5–15.5)
WBC: 6.1 10*3/uL (ref 4.0–10.5)
nRBC: 0 % (ref 0.0–0.2)

## 2023-05-31 LAB — COMPREHENSIVE METABOLIC PANEL
ALT: 23 U/L (ref 0–44)
AST: 29 U/L (ref 15–41)
Albumin: 3.8 g/dL (ref 3.5–5.0)
Alkaline Phosphatase: 53 U/L (ref 38–126)
Anion gap: 12 (ref 5–15)
BUN: 12 mg/dL (ref 6–20)
CO2: 22 mmol/L (ref 22–32)
Calcium: 8.8 mg/dL — ABNORMAL LOW (ref 8.9–10.3)
Chloride: 103 mmol/L (ref 98–111)
Creatinine, Ser: 0.88 mg/dL (ref 0.44–1.00)
GFR, Estimated: >60 mL/min (ref >60)
Glucose, Bld: 117 mg/dL — ABNORMAL HIGH (ref 70–99)
Potassium: 3 mmol/L — ABNORMAL LOW (ref 3.5–5.1)
Sodium: 137 mmol/L (ref 135–145)
Total Bilirubin: 0.7 mg/dL (ref 0.3–1.2)
Total Protein: 7.4 g/dL (ref 6.5–8.1)

## 2023-05-31 LAB — PREPARE RBC (CROSSMATCH)

## 2023-05-31 LAB — SARS CORONAVIRUS 2 BY RT PCR: SARS Coronavirus 2 by RT PCR: NEGATIVE

## 2023-05-31 LAB — D-DIMER, QUANTITATIVE: D-Dimer, Quant: 0.81 ug/mL-FEU — ABNORMAL HIGH (ref 0.00–0.50)

## 2023-05-31 MED ORDER — IOHEXOL 350 MG/ML SOLN
75.0000 mL | Freq: Once | INTRAVENOUS | Status: AC | PRN
  Administered 2023-05-31: 75 mL via INTRAVENOUS

## 2023-05-31 MED ORDER — SODIUM CHLORIDE 0.9% IV SOLUTION: Freq: Once | INTRAVENOUS | Status: AC

## 2023-05-31 MED ORDER — POTASSIUM CHLORIDE CRYS ER 20 MEQ PO TBCR
40.0000 meq | EXTENDED_RELEASE_TABLET | Freq: Once | ORAL | Status: AC
  Administered 2023-05-31: 40 meq via ORAL
  Filled 2023-05-31: qty 2

## 2023-05-31 MED ORDER — FERROUS SULFATE 325 (65 FE) MG PO TABS: 325.0000 mg | ORAL_TABLET | Freq: Every day | ORAL | 0 refills | Status: DC

## 2023-05-31 NOTE — Discharge Instructions (Addendum)
Please take iron pills as prescribed.  Please be sure to follow-up with your physician and discuss your recurrent anemia.  You also need to recheck your blood count in a week with your doctor   Take Tylenol for fever  Return to ER if you have dizziness or chest pain or shortness of breath

## 2023-05-31 NOTE — ED Provider Notes (Signed)
  Physical Exam  BP 130/83   Pulse 74   Temp 98.2 F (36.8 C) (Oral)   Resp 17   Ht 5\' 2"  (1.575 m)   Wt 97.1 kg   LMP 05/24/2023   SpO2 100%   BMI 39.14 kg/m   Physical Exam  Procedures  Procedures  ED Course / MDM    Medical Decision Making Care assumed at 4 PM.  Patient is diagnosed with COVID.  Patient also has symptomatic anemia.  Patient states that she has heavy menses and is chronically anemic.  Patient states that she did not take her iron supplement recently.  She had a positive D-dimer and CTA was negative.  Signout pending transfusion and reassessment and discharge  7:59 PM Patient received 2 units PRBC.  Patient is feeling much better now.  I prescribed her iron supplementation.  Patient's potassium is also low at 3.0.  I also gave her a dose of potassium.  Stable for discharge.  Recommend repeat CBC in the week.  Problems Addressed: COVID: acute illness or injury Symptomatic anemia: acute illness or injury  Amount and/or Complexity of Data Reviewed Labs: ordered. Decision-making details documented in ED Course. Radiology: ordered and independent interpretation performed. Decision-making details documented in ED Course.  Risk Prescription drug management.          Charlynne Pander, MD 05/31/23 [phone]

## 2023-05-31 NOTE — ED Notes (Signed)
Patient transported to CT 

## 2023-05-31 NOTE — ED Notes (Signed)
Pt complained of pain at the IV site in hand.  Blood moved to left [facility]

## 2023-05-31 NOTE — ED Triage Notes (Signed)
Pt bib ems c.o sob, rhonchi noted. 99% on room air. Pt tested positive for COVID about 9 days ago.Pt c.o chest pain. Resp e.u

## 2023-05-31 NOTE — ED Notes (Signed)
Got patient on the monitor did EKG shown to Dr Jeraldine Loots

## 2023-05-31 NOTE — ED Provider Notes (Signed)
[place] EMERGENCY DEPARTMENT AT [facility] Provider Note   CSN: [id number] Arrival date & time: 05/31/23  0849     History  Chief Complaint  Patient presents with   Covid Positive   Shortness of Breath    Kelli Brooks is a 42 y.o. female.  HPI Patient presents via EMS for chest pain, shortness of breath.  She is generally well, until 1 week ago.  She was diagnosed with COVID at that time, and has had persistent viral syndrome symptoms since then.  Today she noticed new chest pain, new dyspnea and return to work.  EMS reports no hemodynamic instability en route, mild hypertension only. Patient notes a history of anemia and is currently on her menstrual cycle.  She has previously required transfusions.    Home Medications Prior to Admission medications   Medication Sig Start Date End Date Taking? Authorizing Provider  famotidine (PEPCID) 20 MG tablet Take 1 tablet (20 mg total) by mouth 2 (two) times daily. Take 30 minutes before breakfast and dinner 10/16/22 11/15/22  Terald Sleeper, MD  famotidine (PEPCID) 40 MG tablet Take 1 tablet (40 mg total) by mouth daily for 7 days. Patient not taking: Reported on 08/19/2021 01/11/21 01/18/21  Jacklynn Bue, MD  ferrous sulfate 325 (65 FE) MG EC tablet Take 1 tablet (325 mg total) by mouth 2 (two) times daily. Patient not taking: Reported on 08/19/2021 11/28/20 11/28/21  Willeen Niece, MD  omeprazole (PRILOSEC) 20 MG capsule Take 1 capsule (20 mg total) by mouth daily. 10/16/22 11/15/22  Terald Sleeper, MD  permethrin (ELIMITE) 5 % cream Apply to affected area once 05/10/23   Curatolo, Adam, DO  potassium chloride (KLOR-CON) 10 MEQ tablet Take 1 tablet (10 mEq total) by mouth daily for 15 days. 10/16/22 10/31/22  Terald Sleeper, MD      Allergies    Patient has no known allergies.    Review of Systems   Review of Systems  All other systems reviewed and are negative.   Physical Exam Updated Vital Signs BP  132/81   Pulse 84   Temp 98.2 F (36.8 C)   Resp 14   Ht 5\' 2"  (1.575 m)   Wt 97.1 kg   LMP 05/24/2023   SpO2 100%   BMI 39.14 kg/m  Physical Exam Vitals and nursing note reviewed.  Constitutional:      General: She is not in acute distress.    Appearance: She is well-developed.  HENT:     Head: Normocephalic and atraumatic.  Eyes:     Conjunctiva/sclera: Conjunctivae normal.  Cardiovascular:     Rate and Rhythm: Regular rhythm. Tachycardia present.  Pulmonary:     Effort: Pulmonary effort is normal. Tachypnea present.     Breath sounds: Decreased breath sounds present.  Abdominal:     General: There is no distension.  Skin:    General: Skin is warm and dry.  Neurological:     Mental Status: She is alert and oriented to person, place, and time.     Cranial Nerves: No cranial nerve deficit.  Psychiatric:        Mood and Affect: Mood normal.     ED Results / Procedures / Treatments   Labs (all labs ordered are listed, but only abnormal results are displayed) Labs Reviewed  COMPREHENSIVE METABOLIC PANEL - Abnormal; Notable for the following components:      Result Value   Potassium 3.0 (*)    Glucose, Bld 117 (*)  Calcium 8.8 (*)    All other components within normal limits  CBC WITH DIFFERENTIAL/PLATELET - Abnormal; Notable for the following components:   Hemoglobin 6.9 (*)    HCT 25.9 (*)    MCV 62.7 (*)    MCH 16.7 (*)    MCHC 26.6 (*)    RDW 22.5 (*)    Platelets 680 (*)    All other components within normal limits  D-DIMER, QUANTITATIVE - Abnormal; Notable for the following components:   D-Dimer, Quant 0.81 (*)    All other components within normal limits  SARS CORONAVIRUS 2 BY RT PCR  TYPE AND SCREEN  PREPARE RBC (CROSSMATCH)    EKG EKG Interpretation Date/Time:  Wednesday May 31 2023 08:56:43 EDT Ventricular Rate:  93 PR Interval:  134 QRS Duration:  113 QT Interval:  377 QTC Calculation: 469 R Axis:   38  Text  Interpretation: Sinus arrhythmia Ventricular premature complex Borderline intraventricular conduction delay Low voltage, precordial leads Borderline T wave abnormalities Baseline wander in lead(s) III Confirmed by Gerhard Munch [phone]) on 05/31/2023 9:01:23 AM  Radiology CT Angio Chest PE W/Cm &/Or Wo Cm  Result Date: 05/31/2023 CLINICAL DATA:  Shortness of breath, positive D-dimer level. EXAM: CT ANGIOGRAPHY CHEST WITH CONTRAST TECHNIQUE: Multidetector CT imaging of the chest was performed using the standard protocol during bolus administration of intravenous contrast. Multiplanar CT image reconstructions and MIPs were obtained to evaluate the vascular anatomy. RADIATION DOSE REDUCTION: This exam was performed according to the departmental dose-optimization program which includes automated exposure control, adjustment of the mA and/or kV according to patient size and/or use of iterative reconstruction technique. CONTRAST:  75mL OMNIPAQUE IOHEXOL 350 MG/ML SOLN COMPARISON:  None Available. FINDINGS: Cardiovascular: Satisfactory opacification of the pulmonary arteries to the segmental level. No evidence of pulmonary embolism. Mild cardiomegaly. No pericardial effusion. Mediastinum/Nodes: No enlarged mediastinal, hilar, or axillary lymph nodes. Thyroid gland, trachea, and esophagus demonstrate no significant findings. Lungs/Pleura: Lungs are clear. No pleural effusion or pneumothorax. Upper Abdomen: No acute abnormality. Musculoskeletal: No chest wall abnormality. No acute or significant osseous findings. Review of the MIP images confirms the above findings. IMPRESSION: No definite evidence of pulmonary embolus. Electronically Signed   By: Lupita Raider M.D.   On: 05/31/2023 13:41   DG Chest Port 1 View  Result Date: 05/31/2023 CLINICAL DATA:  Shortness of breath cough EXAM: PORTABLE CHEST 1 VIEW COMPARISON:  05/23/2023 FINDINGS: The heart size and mediastinal contours are within normal limits. Both lungs  are clear. The visualized skeletal structures are unremarkable. IMPRESSION: No active disease. Electronically Signed   By: Judie Petit.  Shick M.D.   On: 05/31/2023 09:40    Procedures Procedures    Medications Ordered in ED Medications  0.9 %  sodium chloride infusion (Manually program via Guardrails IV Fluids) (has no administration in time range)  iohexol (OMNIPAQUE) 350 MG/ML injection 75 mL (75 mLs Intravenous Contrast Given 05/31/23 1210)    ED Course/ Medical Decision Making/ A&P                                 Medical Decision Making Previously well female history of anemia presents in the context of known COVID with shortness pain shortness of breath.  Differential includes symptomatic viral infection versus complications of anemia versus PE/pneumonia/bacteremia, sepsis Cardiac 105 sinus tach abnormal pulse ox 99% room air normal  Amount and/or Complexity of Data Reviewed Independent Historian: EMS Labs:  ordered. Decision-making details documented in ED Course. Radiology: ordered and independent interpretation performed. Decision-making details documented in ED Course. ECG/medicine tests: ordered and independent interpretation performed. Decision-making details documented in ED Course.  Risk Prescription drug management. Decision regarding hospitalization.   1:56 PM Patient notes that she feels like" poop".  CT scan without evidence for pulmonary embolism, suspicion for multifactorial etiology given her positive COVID test, and anemia likely symptomatic. With no evidence for pneumonia, bacteremia, sepsis, patient will receive transfusions, is otherwise then likely appropriate for discharge with outpatient primary care follow-up.  Dr. Silverio Lay aware.         Final Clinical Impression(s) / ED Diagnoses Final diagnoses:  COVID  Symptomatic anemia    CRITICAL CARE Performed by: Gerhard Munch Total critical care time: 35 minutes Critical care time was exclusive of separately  billable procedures and treating other patients. Critical care was necessary to treat or prevent imminent or life-threatening deterioration. Critical care was time spent personally by me on the following activities: development of treatment plan with patient and/or surrogate as well as nursing, discussions with consultants, evaluation of patient's response to treatment, examination of patient, obtaining history from patient or surrogate, ordering and performing treatments and interventions, ordering and review of laboratory studies, ordering and review of radiographic studies, pulse oximetry and re-evaluation of patient's condition.     Gerhard Munch, MD 05/31/23 1400

## 2023-06-01 LAB — BPAM RBC
Blood Product Expiration Date: 202410222359
Blood Product Expiration Date: 202410222359
ISSUE DATE / TIME: 202409251405
ISSUE DATE / TIME: 202409251628
Unit Type and Rh: 5100
Unit Type and Rh: 5100

## 2023-06-01 LAB — TYPE AND SCREEN
ABO/RH(D): O POS
Antibody Screen: POSITIVE
Donor AG Type: NEGATIVE
Donor AG Type: NEGATIVE
PT AG Type: NEGATIVE
Unit division: 0
Unit division: 0

## 2023-06-02 ENCOUNTER — Other Ambulatory Visit (HOSPITAL_BASED_OUTPATIENT_CLINIC_OR_DEPARTMENT_OTHER): Payer: Self-pay

## 2023-06-06 ENCOUNTER — Encounter (HOSPITAL_COMMUNITY): Payer: Self-pay

## 2023-06-06 ENCOUNTER — Ambulatory Visit (HOSPITAL_COMMUNITY)
Admission: EM | Admit: 2023-06-06 | Discharge: 2023-06-06 | Disposition: A | Discharge status: Home / Self Care | Payer: Self-pay | Attending: Emergency Medicine | Admitting: Emergency Medicine

## 2023-06-06 DIAGNOSIS — R052 Subacute cough: Secondary | ICD-10-CM

## 2023-06-06 MED ORDER — BENZONATATE 100 MG PO CAPS: 100.0000 mg | ORAL_CAPSULE | Freq: Three times a day (TID) | ORAL | 0 refills | Status: DC

## 2023-06-06 MED ORDER — PROMETHAZINE-DM 6.25-15 MG/5ML PO SYRP
5.0000 mL | ORAL_SOLUTION | Freq: Every evening | ORAL | 0 refills | Status: AC | PRN
Start: 2023-06-06 — End: ?

## 2023-06-06 NOTE — ED Provider Notes (Addendum)
[facility]    CSN: [id number] Arrival date & time: 06/06/23  0802      History   Chief Complaint Chief Complaint  Patient presents with   Letter for School/Work    HPI Kelli Brooks is a 42 y.o. female.   Patient presents with persistent and productive cough with white sputum x 2 weeks after having COVID.  Denies shortness of breath, chest pain, and fever.     Past Medical History:  Diagnosis Date   Anemia     Patient Active Problem List   Diagnosis Date Noted   Symptomatic anemia 11/26/2020   Menorrhagia 11/26/2020   Nausea and vomiting 11/26/2020   Chest pain 11/26/2020   Hypokalemia 11/26/2020   Thrombocytosis 11/26/2020   Renal insufficiency 11/26/2020    Past Surgical History:  Procedure Laterality Date   CESAREAN SECTION      OB History   No obstetric history on file.      Home Medications    Prior to Admission medications   Medication Sig Start Date End Date Taking? Authorizing Provider  benzonatate (TESSALON) 100 MG capsule Take 1 capsule (100 mg total) by mouth every 8 (eight) hours. 06/06/23  Yes Susann Givens, Shereese Bonnie A, NP  promethazine-dextromethorphan (PROMETHAZINE-DM) 6.25-15 MG/5ML syrup Take 5 mLs by mouth at bedtime as needed for cough. 06/06/23  Yes Susann Givens, Netty Sullivant A, NP  famotidine (PEPCID) 20 MG tablet Take 1 tablet (20 mg total) by mouth 2 (two) times daily. Take 30 minutes before breakfast and dinner 10/16/22 11/15/22  Terald Sleeper, MD  famotidine (PEPCID) 40 MG tablet Take 1 tablet (40 mg total) by mouth daily for 7 days. Patient not taking: Reported on 08/19/2021 01/11/21 01/18/21  Jacklynn Bue, MD  ferrous sulfate 325 (65 FE) MG tablet Take 1 tablet (325 mg total) by mouth daily. 05/31/23   Charlynne Pander, MD  omeprazole (PRILOSEC) 20 MG capsule Take 1 capsule (20 mg total) by mouth daily. 10/16/22 11/15/22  Terald Sleeper, MD  permethrin (ELIMITE) 5 % cream Apply to affected area once 05/10/23   Curatolo, Adam,  DO  potassium chloride (KLOR-CON) 10 MEQ tablet Take 1 tablet (10 mEq total) by mouth daily for 15 days. 10/16/22 10/31/22  Terald Sleeper, MD    Family History Family History  Problem Relation Age of Onset   Heart attack Brother 68   Heart attack Father 18    Social History Social History   Tobacco Use   Smoking status: Never   Smokeless tobacco: Never  Vaping Use   Vaping status: Never Used  Substance Use Topics   Alcohol use: Yes    Comment: occ   Drug use: No     Allergies   Patient has no known allergies.   Review of Systems Review of Systems  Constitutional:  Negative for chills, fatigue and fever.  HENT:  Negative for congestion, rhinorrhea and sore throat.   Respiratory:  Positive for cough. Negative for chest tightness, shortness of breath and wheezing.   Cardiovascular:  Negative for chest pain.     Physical Exam Triage Vital Signs ED Triage Vitals  Encounter Vitals Group     BP 06/06/23 0836 (!) 153/113     Systolic BP Percentile --      Diastolic BP Percentile --      Pulse Rate 06/06/23 0836 94     Resp 06/06/23 0836 18     Temp 06/06/23 0836 98.3 F (36.8 C)     Temp  src --      SpO2 06/06/23 0836 96 %     Weight --      Height --      Head Circumference --      Peak Flow --      Pain Score 06/06/23 0835 7     Pain Loc --      Pain Education --      Exclude from Growth Chart --    No data found.  Updated Vital Signs BP (!) 153/113 Comment: checked twice  Pulse 94   Temp 98.3 F (36.8 C)   Resp 18   LMP 05/24/2023   SpO2 96%   Visual Acuity Right Eye Distance:   Left Eye Distance:   Bilateral Distance:    Right Eye Near:   Left Eye Near:    Bilateral Near:     Physical Exam Vitals and nursing note reviewed.  Constitutional:      General: She is awake. She is not in acute distress.    Appearance: Normal appearance. She is well-developed and well-groomed. She is not ill-appearing, toxic-appearing or diaphoretic.   Cardiovascular:     Rate and Rhythm: Normal rate.     Heart sounds: Normal heart sounds.  Pulmonary:     Effort: Pulmonary effort is normal.     Breath sounds: Normal breath sounds.  Skin:    General: Skin is warm and dry.  Neurological:     Mental Status: She is alert.  Psychiatric:        Behavior: Behavior is cooperative.      UC Treatments / Results  Labs (all labs ordered are listed, but only abnormal results are displayed) Labs Reviewed - No data to display  EKG   Radiology No results found.  Procedures Procedures (including critical care time)  Medications Ordered in UC Medications - No data to display  Initial Impression / Assessment and Plan / UC Course  I have reviewed the triage vital signs and the nursing notes.  Pertinent labs & imaging results that were available during my care of the patient were reviewed by me and considered in my medical decision making (see chart for details).     Patient presented with history of persistent productive cough x 2 weeks after having COVID.  Denies shortness of breath, chest pain, and fever.  Upon assessment lungs clear bilaterally on auscultation.  No other significant findings. Patient requesting work note.  Blood pressure was 153/113 in clinic.  Patient stated that she just changed jobs and is trying to get insurance to get a primary care doctor.  Prescribed Tessalon and promethazine DM for cough.  Discussed follow-up, return, emergency department precautions. Final Clinical Impressions(s) / UC Diagnoses   Final diagnoses:  Subacute cough     Discharge Instructions      You can take Tessalon as needed for cough every 8 hours and promethazine cough syrup at night to help with cough and sleep. Do not take cough syrup while working or driving as it can make you drowsy. Return here as needed. Once you get insurance with your job please get established with a primary care doctor regarding your blood pressure. If  you develop chest pain, trouble breathing, severe headache, weakness, numbness, blurred vision, or confusion please seek immediate medical treatment in the ER.      ED Prescriptions     Medication Sig Dispense Auth. Provider   benzonatate (TESSALON) 100 MG capsule Take 1 capsule (100 mg total) by  mouth every 8 (eight) hours. 21 capsule Wynonia Lawman A, NP   promethazine-dextromethorphan (PROMETHAZINE-DM) 6.25-15 MG/5ML syrup Take 5 mLs by mouth at bedtime as needed for cough. 118 mL Wynonia Lawman A, NP      PDMP not reviewed this encounter.   Letta Kocher, NP 06/06/23 0914    Letta Kocher, NP 06/06/23 [phone]

## 2023-06-06 NOTE — ED Triage Notes (Signed)
Pt was seen for covid in the ED on 9/25. Pt is in need of a work note. Pt continues to have a cough. Denies any other complaints. Cough is productive and getting worse.

## 2023-06-06 NOTE — Discharge Instructions (Addendum)
You can take Tessalon as needed for cough every 8 hours and promethazine cough syrup at night to help with cough and sleep. Do not take cough syrup while working or driving as it can make you drowsy. Return here as needed. Once you get insurance with your job please get established with a primary care doctor regarding your blood pressure. If you develop chest pain, trouble breathing, severe headache, weakness, numbness, blurred vision, or confusion please seek immediate medical treatment in the ER.

## 2023-07-02 ENCOUNTER — Emergency Department (HOSPITAL_BASED_OUTPATIENT_CLINIC_OR_DEPARTMENT_OTHER): Payer: Self-pay

## 2023-07-02 ENCOUNTER — Emergency Department (HOSPITAL_BASED_OUTPATIENT_CLINIC_OR_DEPARTMENT_OTHER)
Admission: EM | Admit: 2023-07-02 | Discharge: 2023-07-03 | Disposition: A | Discharge status: Home / Self Care | Payer: Self-pay | Attending: Emergency Medicine | Admitting: Emergency Medicine

## 2023-07-02 ENCOUNTER — Encounter (HOSPITAL_BASED_OUTPATIENT_CLINIC_OR_DEPARTMENT_OTHER): Payer: Self-pay

## 2023-07-02 ENCOUNTER — Other Ambulatory Visit: Payer: Self-pay

## 2023-07-02 DIAGNOSIS — S93401A Sprain of unspecified ligament of right ankle, initial encounter: Secondary | ICD-10-CM | POA: Insufficient documentation

## 2023-07-02 DIAGNOSIS — X501XXA Overexertion from prolonged static or awkward postures, initial encounter: Secondary | ICD-10-CM | POA: Insufficient documentation

## 2023-07-02 NOTE — ED Triage Notes (Signed)
Pt tripped over some tile on Thursday. Now complains of R ankle pain.

## 2023-07-02 NOTE — ED Notes (Signed)
X-ray at bedside

## 2023-07-03 NOTE — ED Provider Notes (Signed)
[place] EMERGENCY DEPARTMENT AT [facility]  Provider Note  CSN: [id number] Arrival date & time: 07/02/23 2311  History Chief Complaint  Patient presents with   Foot Pain    Kelli Brooks is a 42 y.o. female reports she twisted her ankle on an uneven floor 3-4 days ago. Began hurting more yesterday, worse with walking.    Home Medications Prior to Admission medications   Medication Sig Start Date End Date Taking? Authorizing Provider  benzonatate (TESSALON) 100 MG capsule Take 1 capsule (100 mg total) by mouth every 8 (eight) hours. 06/06/23   Wynonia Lawman A, NP  famotidine (PEPCID) 20 MG tablet Take 1 tablet (20 mg total) by mouth 2 (two) times daily. Take 30 minutes before breakfast and dinner 10/16/22 11/15/22  Terald Sleeper, MD  famotidine (PEPCID) 40 MG tablet Take 1 tablet (40 mg total) by mouth daily for 7 days. Patient not taking: Reported on 08/19/2021 01/11/21 01/18/21  Jacklynn Bue, MD  ferrous sulfate 325 (65 FE) MG tablet Take 1 tablet (325 mg total) by mouth daily. 05/31/23   Charlynne Pander, MD  omeprazole (PRILOSEC) 20 MG capsule Take 1 capsule (20 mg total) by mouth daily. 10/16/22 11/15/22  Terald Sleeper, MD  permethrin (ELIMITE) 5 % cream Apply to affected area once 05/10/23   Curatolo, Adam, DO  potassium chloride (KLOR-CON) 10 MEQ tablet Take 1 tablet (10 mEq total) by mouth daily for 15 days. 10/16/22 10/31/22  Terald Sleeper, MD  promethazine-dextromethorphan (PROMETHAZINE-DM) 6.25-15 MG/5ML syrup Take 5 mLs by mouth at bedtime as needed for cough. 06/06/23   Letta Kocher, NP     Allergies    Patient has no known allergies.   Review of Systems   Review of Systems Please see HPI for pertinent positives and negatives  Physical Exam BP (!) 156/102   Pulse 80   Temp 98.4 F (36.9 C) (Oral)   Resp 16   Ht 5\' 2"  (1.575 m)   Wt 97.1 kg   LMP 06/29/2023 (Exact Date)   SpO2 95%   BMI 39.14 kg/m   Physical Exam Vitals  and nursing note reviewed.  HENT:     Head: Normocephalic.     Nose: Nose normal.  Eyes:     Extraocular Movements: Extraocular movements intact.  Pulmonary:     Effort: Pulmonary effort is normal.  Musculoskeletal:        General: Tenderness (R dorsal ankle, no maleolar tenderness.) present. No swelling or deformity. Normal range of motion.     Cervical back: Neck supple.  Skin:    Findings: No rash (on exposed skin).  Neurological:     Mental Status: She is alert and oriented to person, place, and time.  Psychiatric:        Mood and Affect: Mood normal.     ED Results / Procedures / Treatments   EKG None  Procedures Procedures  Medications Ordered in the ED Medications - No data to display  Initial Impression and Plan  Patient here with R ankle injury. Will check xray.  ED Course   Clinical Course as of 07/03/23 0035  Mon Jul 03, 2023  0033 I personally viewed the images from radiology studies and agree with radiologist interpretation: Xray is neg. Plan ASO, motrin/APAP as needed for pain. PCP follow up, RTED for any other concerns.   [CS]    Clinical Course User Index [CS] Pollyann Savoy, MD     MDM Rules/Calculators/A&P  Medical Decision Making Problems Addressed: Sprain of right ankle, unspecified ligament, initial encounter: acute illness or injury  Amount and/or Complexity of Data Reviewed Radiology: ordered and independent interpretation performed. Decision-making details documented in ED Course.  Risk OTC drugs.     Final Clinical Impression(s) / ED Diagnoses Final diagnoses:  Sprain of right ankle, unspecified ligament, initial encounter    Rx / DC Orders ED Discharge Orders     None        Pollyann Savoy, MD 07/03/23 [phone]

## 2023-07-03 NOTE — ED Notes (Signed)
..  The patient is A&OX4, ambulatory at d/c with independent steady gait, NAD. Pt verbalized understanding of d/c instructions and follow up care.

## 2023-07-04 ENCOUNTER — Other Ambulatory Visit: Payer: Self-pay

## 2023-07-04 ENCOUNTER — Emergency Department (HOSPITAL_BASED_OUTPATIENT_CLINIC_OR_DEPARTMENT_OTHER)
Admission: EM | Admit: 2023-07-04 | Discharge: 2023-07-04 | Disposition: A | Discharge status: Home / Self Care | Payer: Self-pay | Attending: Emergency Medicine | Admitting: Emergency Medicine

## 2023-07-04 DIAGNOSIS — W1849XA Other slipping, tripping and stumbling without falling, initial encounter: Secondary | ICD-10-CM | POA: Insufficient documentation

## 2023-07-04 DIAGNOSIS — S93401D Sprain of unspecified ligament of right ankle, subsequent encounter: Secondary | ICD-10-CM | POA: Insufficient documentation

## 2023-07-04 MED ORDER — KETOROLAC TROMETHAMINE 15 MG/ML IJ SOLN
15.0000 mg | Freq: Once | INTRAMUSCULAR | Status: AC
Start: 2023-07-04 — End: 2023-07-04
  Administered 2023-07-04: 15 mg via INTRAVENOUS
  Filled 2023-07-04: qty 1

## 2023-07-04 NOTE — Discharge Instructions (Addendum)
It was a pleasure caring for you today.  As discussed, you need to follow-up with your primary care provider in the next 5-7 days if your ankle is not healing appropriately.  Seek emergency care if experiencing any new or worsening symptoms.  Alternating between 650 mg Tylenol and 400 mg Advil: The best way to alternate taking Acetaminophen (example Tylenol) and Ibuprofen (example Advil/Motrin) is to take them 3 hours apart. For example, if you take ibuprofen at 6 am you can then take Tylenol at 9 am. You can continue this regimen throughout the day, making sure you do not exceed the recommended maximum dose for each drug.

## 2023-07-04 NOTE — ED Provider Notes (Signed)
[place] [facility] AT [facility] Provider Note   CSN: [id number] Arrival date & time: 07/04/23  1224     History  Chief Complaint  Patient presents with   Ankle Pain    Kelli Brooks is a 42 y.o. female who presents to ED concerned for right ankle pain. Patient rolled her ankle 5 days ago. Patient was seen in ED yesterday with negative imaging. Patient has been taking tylenol for pain relief. Patient came back to ED today because her ankle is still in pain and she would like an extension on her work excuse. Patient stating that the work excuse she received yesterday only covered 2 days. Denying any other symptoms today.   Ankle Pain      Home Medications Prior to Admission medications   Medication Sig Start Date End Date Taking? Authorizing Provider  benzonatate (TESSALON) 100 MG capsule Take 1 capsule (100 mg total) by mouth every 8 (eight) hours. 06/06/23   Wynonia Lawman A, NP  famotidine (PEPCID) 20 MG tablet Take 1 tablet (20 mg total) by mouth 2 (two) times daily. Take 30 minutes before breakfast and dinner 10/16/22 11/15/22  Terald Sleeper, MD  famotidine (PEPCID) 40 MG tablet Take 1 tablet (40 mg total) by mouth daily for 7 days. Patient not taking: Reported on 08/19/2021 01/11/21 01/18/21  Jacklynn Bue, MD  ferrous sulfate 325 (65 FE) MG tablet Take 1 tablet (325 mg total) by mouth daily. 05/31/23   Charlynne Pander, MD  omeprazole (PRILOSEC) 20 MG capsule Take 1 capsule (20 mg total) by mouth daily. 10/16/22 11/15/22  Terald Sleeper, MD  permethrin (ELIMITE) 5 % cream Apply to affected area once 05/10/23   Curatolo, Adam, DO  potassium chloride (KLOR-CON) 10 MEQ tablet Take 1 tablet (10 mEq total) by mouth daily for 15 days. 10/16/22 10/31/22  Terald Sleeper, MD  promethazine-dextromethorphan (PROMETHAZINE-DM) 6.25-15 MG/5ML syrup Take 5 mLs by mouth at bedtime as needed for cough. 06/06/23   Letta Kocher, NP      Allergies     Patient has no known allergies.    Review of Systems   Review of Systems  Musculoskeletal:        Ankle pain    Physical Exam Updated Vital Signs BP (!) 134/99 (BP Location: Left Arm)   Pulse 70   Temp 97.7 F (36.5 C) (Oral)   Resp 16   Ht 5\' 2"  (1.575 m)   LMP 06/29/2023 (Exact Date)   SpO2 99%   BMI 39.14 kg/m  Physical Exam Vitals and nursing note reviewed.  Constitutional:      General: She is not in acute distress.    Appearance: She is not ill-appearing or toxic-appearing.  HENT:     Head: Normocephalic and atraumatic.  Eyes:     General: No scleral icterus.       Right eye: No discharge.        Left eye: No discharge.     Conjunctiva/sclera: Conjunctivae normal.  Cardiovascular:     Rate and Rhythm: Normal rate.     Pulses: Normal pulses.  Pulmonary:     Effort: Pulmonary effort is normal.  Abdominal:     General: Abdomen is flat.  Musculoskeletal:     Comments: Mild swelling of the lateral malleolus. +2 pedal pulse. Sensation to light touch intact. Active ROM fully intact. No erythema or increased warmth.   Skin:    General: Skin is warm and dry.  Neurological:  General: No focal deficit present.     Mental Status: She is alert. Mental status is at baseline.  Psychiatric:        Mood and Affect: Mood normal.        Behavior: Behavior normal.     ED Results / Procedures / Treatments   Labs (all labs ordered are listed, but only abnormal results are displayed) Labs Reviewed - No data to display  EKG None  Radiology DG Ankle Complete Right  Result Date: 07/03/2023 CLINICAL DATA:  Patient tripped on x4 days ago. Right ankle injury, pain EXAM: RIGHT ANKLE - COMPLETE 3+ VIEW COMPARISON:  Radiographs 04/02/2020 FINDINGS: No acute fracture or dislocation.  Soft tissues are unremarkable. IMPRESSION: No acute fracture or dislocation. Electronically Signed   By: Minerva Fester M.D.   On: 07/03/2023 00:21    Procedures Procedures    Medications  Ordered in ED Medications  ketorolac (TORADOL) 15 MG/ML injection 15 mg (has no administration in time range)    ED Course/ Medical Decision Making/ A&P                                 Medical Decision Making Risk Prescription drug management.   This patient presents to the ED for concern of right ankle pain, this involves an extensive number of treatment options, and is a complaint that carries with it a high risk of complications and morbidity.  The differential diagnosis includes hemarthrosis, gout, septic joint, fracture, muscle strain, compartment syndrome   Co morbidities that complicate the patient evaluation  none   Additional history obtained:  Additional history obtained from ED note yesterday: Patient seen for right ankle pain.  X-ray negative.   Problem List / ED Course / Critical interventions / Medication management  Patient presents to ED concern for right ankle pain.  Patient seen in ED yesterday with negative x-rays.  Patient stating that her ankle still hurting her today and she was not given enough days off in her work excuse. Physical exam reassuring.  Patient afebrile with stable vitals.  Patient did have initial mild tachycardia which resolved a few minutes later without medical management. Patient stating that she has only been taking tylenol for pain. Provided patient with Toradol in ED. Educated patient on alternating Tylenol and ibuprofen for pain control.  Recommended following up with PCP if symptoms do not appropriately resolve over the next week.  Will provide patient another work excuse.  Also provided patient with ankle physical therapy for her to start at home. I have reviewed the patients home medicines and have made adjustments as needed Patient afebrile with stable vitals.  Provided with return precautions.  Discharged in condition.   Ddx these are considered less likely due to history of present illness and physical exam -hemarthrosis: joint  without swelling; ROM intact -gout: no warmth or erythema; ROM intact  -septic joint: afebrile; no warmth or erythema; no skin changes; ROM intact  -fracture: xray without concern  -compartment syndrome: area not tense; neurovascularly intact   Social Determinants of Health:  none           Final Clinical Impression(s) / ED Diagnoses Final diagnoses:  Sprain of right ankle, unspecified ligament, subsequent encounter    Rx / DC Orders ED Discharge Orders     None         Dorthy Cooler, [place] 07/04/23 1335    Ernie Avena, MD 07/04/23  1449  

## 2023-07-04 NOTE — ED Triage Notes (Signed)
Pt POV in wheelchair- c/o ongoing r ankle pain, recently seen for same. Wearing brace in triage.   Took tylenol0800 today, no relief.

## 2023-12-08 ENCOUNTER — Emergency Department (HOSPITAL_BASED_OUTPATIENT_CLINIC_OR_DEPARTMENT_OTHER)
Admission: EM | Admit: 2023-12-08 | Discharge: 2023-12-08 | Disposition: A | Discharge status: Home / Self Care | Payer: Self-pay | Attending: Emergency Medicine | Admitting: Emergency Medicine

## 2023-12-08 ENCOUNTER — Emergency Department (HOSPITAL_BASED_OUTPATIENT_CLINIC_OR_DEPARTMENT_OTHER): Payer: Self-pay

## 2023-12-08 ENCOUNTER — Other Ambulatory Visit: Payer: Self-pay

## 2023-12-08 DIAGNOSIS — R0789 Other chest pain: Secondary | ICD-10-CM | POA: Insufficient documentation

## 2023-12-08 LAB — BASIC METABOLIC PANEL WITH GFR
Anion gap: 10 (ref 5–15)
BUN: 11 mg/dL (ref 6–20)
CO2: 21 mmol/L — ABNORMAL LOW (ref 22–32)
Calcium: 8.8 mg/dL — ABNORMAL LOW (ref 8.9–10.3)
Chloride: 105 mmol/L (ref 98–111)
Creatinine, Ser: 1.12 mg/dL — ABNORMAL HIGH (ref 0.44–1.00)
GFR, Estimated: >60 mL/min (ref >60)
Glucose, Bld: 153 mg/dL — ABNORMAL HIGH (ref 70–99)
Potassium: 3.2 mmol/L — ABNORMAL LOW (ref 3.5–5.1)
Sodium: 136 mmol/L (ref 135–145)

## 2023-12-08 LAB — CBC
HCT: 26.8 % — ABNORMAL LOW (ref 36.0–46.0)
Hemoglobin: 7.6 g/dL — ABNORMAL LOW (ref 12.0–15.0)
MCH: 17.8 pg — ABNORMAL LOW (ref 26.0–34.0)
MCHC: 28.4 g/dL — ABNORMAL LOW (ref 30.0–36.0)
MCV: 62.6 fL — ABNORMAL LOW (ref 80.0–100.0)
Platelets: 460 10*3/uL — ABNORMAL HIGH (ref 150–400)
RBC: 4.28 MIL/uL (ref 3.87–5.11)
RDW: 19.6 % — ABNORMAL HIGH (ref 11.5–15.5)
WBC: 6.8 10*3/uL (ref 4.0–10.5)
nRBC: 0 % (ref 0.0–0.2)

## 2023-12-08 LAB — LIPASE, BLOOD: Lipase: 31 U/L (ref 11–51)

## 2023-12-08 LAB — HEPATIC FUNCTION PANEL
ALT: 14 U/L (ref 0–44)
AST: 22 U/L (ref 15–41)
Albumin: 3.7 g/dL (ref 3.5–5.0)
Alkaline Phosphatase: 61 U/L (ref 38–126)
Bilirubin, Direct: 0.1 mg/dL (ref 0.0–0.2)
Indirect Bilirubin: 0.5 mg/dL (ref 0.3–0.9)
Total Bilirubin: 0.6 mg/dL (ref 0.0–1.2)
Total Protein: 7.5 g/dL (ref 6.5–8.1)

## 2023-12-08 LAB — TROPONIN I (HIGH SENSITIVITY): Troponin I (High Sensitivity): 9 ng/L (ref <18)

## 2023-12-08 LAB — D-DIMER, QUANTITATIVE: D-Dimer, Quant: 0.95 ug{FEU}/mL — ABNORMAL HIGH (ref 0.00–0.50)

## 2023-12-08 LAB — HCG, SERUM, QUALITATIVE: Preg, Serum: NEGATIVE

## 2023-12-08 MED ORDER — IOHEXOL 350 MG/ML SOLN
100.0000 mL | Freq: Once | INTRAVENOUS | Status: AC | PRN
  Administered 2023-12-08: 100 mL via INTRAVENOUS

## 2023-12-08 MED ORDER — METHOCARBAMOL 500 MG PO TABS: 500.0000 mg | ORAL_TABLET | Freq: Three times a day (TID) | ORAL | 0 refills | Status: DC | PRN

## 2023-12-08 NOTE — Discharge Instructions (Signed)

## 2023-12-08 NOTE — ED Triage Notes (Signed)
 Pt reports right side chest pain that developed last night. Pt called EMS and was told she had a pulled muscle.  SOB, HA, and dizziness developed this AM. Symptoms began to worsen this morning.    Hx of anemia

## 2023-12-08 NOTE — ED Provider Notes (Signed)
 Emergency Department Provider Note   I have reviewed the triage vital signs and the nursing notes.   HISTORY  Chief Complaint Chest Pain   HPI Kelli Brooks is a 43 y.o. female with past history of anemia requiring blood transfusions presents to the emergency department with sharp, right-sided pain which began suddenly during an argument over the phone with her boyfriend.  She describes pain worse with movement but also deep breathing.  She does feel little bit short of breath.  Denies any obvious injury.  No similar pain in the past.  She does not take OCPs or other hormone medications.  No recent travel or surgeries.  Past Medical History:  Diagnosis Date   Anemia     Review of Systems  Constitutional: No fever/chills Cardiovascular: Positive chest pain. Respiratory: Positive shortness of breath. Gastrointestinal: No abdominal pain.  No nausea, no vomiting.  Musculoskeletal: Negative for back pain. Skin: Negative for rash. Neurological: Negative for headaches.  ____________________________________________   PHYSICAL EXAM:  VITAL SIGNS: ED Triage Vitals  Encounter Vitals Group     BP 12/08/23 0822 125/82     Pulse Rate 12/08/23 0819 (!) 143     Resp 12/08/23 0825 18     Temp 12/08/23 0825 97.9 F (36.6 C)     Temp Source 12/08/23 0825 Oral     SpO2 12/08/23 0819 100 %   Constitutional: Alert and oriented. Well appearing and in no acute distress. Eyes: Conjunctivae are normal.  Head: Atraumatic. Nose: No congestion/rhinnorhea. Mouth/Throat: Mucous membranes are moist.   Neck: No stridor.   Cardiovascular: Sinus tachycardia. Good peripheral circulation. Grossly normal heart sounds.   Respiratory: Normal respiratory effort.  No retractions. Lungs CTAB. Gastrointestinal: Soft and nontender. No distention.  Musculoskeletal: No lower extremity tenderness nor edema. No gross deformities of extremities. Neurologic:  Normal speech and language.  Skin:  Skin is  warm, dry and intact. No rash noted.  ____________________________________________   LABS (all labs ordered are listed, but only abnormal results are displayed)  Labs Reviewed  BASIC METABOLIC PANEL WITH GFR - Abnormal; Notable for the following components:      Result Value   Potassium 3.2 (*)    CO2 21 (*)    Glucose, Bld 153 (*)    Creatinine, Ser 1.12 (*)    Calcium 8.8 (*)    All other components within normal limits  CBC - Abnormal; Notable for the following components:   Hemoglobin 7.6 (*)    HCT 26.8 (*)    MCV 62.6 (*)    MCH 17.8 (*)    MCHC 28.4 (*)    RDW 19.6 (*)    Platelets 460 (*)    All other components within normal limits  D-DIMER, QUANTITATIVE - Abnormal; Notable for the following components:   D-Dimer, Quant 0.95 (*)    All other components within normal limits  HEPATIC FUNCTION PANEL  LIPASE, BLOOD  HCG, SERUM, QUALITATIVE  TROPONIN I (HIGH SENSITIVITY)   ____________________________________________  EKG   EKG Interpretation Date/Time:  Friday December 08 2023 08:25:12 EDT Ventricular Rate:  99 PR Interval:  146 QRS Duration:  90 QT Interval:  347 QTC Calculation: 446 R Axis:   16  Text Interpretation: Sinus rhythm Borderline T abnormalities, anterior leads Confirmed by Alona Bene [phone]) on 12/08/2023 8:30:56 AM        ____________________________________________   PROCEDURES  Procedure(s) performed:   Procedures  None  ____________________________________________   INITIAL IMPRESSION / ASSESSMENT AND PLAN /  ED COURSE  Pertinent labs & imaging results that were available during my care of the patient were reviewed by me and considered in my medical decision making (see chart for details).   This patient is Presenting for Evaluation of CP, which does require a range of treatment options, and is a complaint that involves a high risk of morbidity and mortality.  The Differential Diagnoses includes but is not exclusive to acute  coronary syndrome, aortic dissection, pulmonary embolism, cardiac tamponade, community-acquired pneumonia, pericarditis, musculoskeletal chest wall pain, etc.   Critical Interventions-    Medications  iohexol (OMNIPAQUE) 350 MG/ML injection 100 mL (100 mLs Intravenous Contrast Given 12/08/23 1000)    Reassessment after intervention: no worsening symptoms.    Clinical Laboratory Tests Ordered, included troponin negative. D dimer slightly elevated. No AKI. Mild/moderate anemia.   Radiologic Tests Ordered, included CXR. I independently interpreted the images and agree with radiology interpretation.   Cardiac Monitor Tracing which shows sinus tachycardia.    Social Determinants of Health Risk patient is a non-smoker.   Medical Decision Making: Summary:  The patient presents the emergency department for evaluation of fairly atypical, sudden onset chest pain.  Will need workup for PE as well as ACS along with anemia.  Vital signs stable with a mild tachycardia.  No hypotension or hypoxemia.  Patient is well-appearing overall.  Reevaluation with update and discussion with patient. CTA negative for PE. Plan for MSK pain treatment.   Considered admission but evaluation reassuring with regard to lift-threatening causes of CP>   Patient's presentation is most consistent with acute presentation with potential threat to life or bodily function.   Disposition: discharge  ____________________________________________  FINAL CLINICAL IMPRESSION(S) / ED DIAGNOSES  Final diagnoses:  Atypical chest pain     NEW OUTPATIENT MEDICATIONS STARTED DURING THIS VISIT:  Discharge Medication List as of 12/08/2023 10:59 AM     START taking these medications   Details  methocarbamol (ROBAXIN) 500 MG tablet Take 1 tablet (500 mg total) by mouth every 8 (eight) hours as needed., Starting Fri 12/08/2023, Normal        Note:  This document was prepared using Dragon voice recognition software and may  include unintentional dictation errors.  Alona Bene, MD, [facility] Emergency Medicine    Sameera Betton, Arlyss Repress, MD 12/11/23 [phone]

## 2024-08-20 ENCOUNTER — Observation Stay (HOSPITAL_BASED_OUTPATIENT_CLINIC_OR_DEPARTMENT_OTHER)
Admission: EM | Admit: 2024-08-20 | Discharge: 2024-08-22 | Disposition: A | Discharge status: Home / Self Care | Payer: MEDICAID | Attending: Internal Medicine | Admitting: Internal Medicine

## 2024-08-20 ENCOUNTER — Encounter (HOSPITAL_BASED_OUTPATIENT_CLINIC_OR_DEPARTMENT_OTHER): Payer: Self-pay

## 2024-08-20 ENCOUNTER — Emergency Department (HOSPITAL_BASED_OUTPATIENT_CLINIC_OR_DEPARTMENT_OTHER): Payer: MEDICAID

## 2024-08-20 ENCOUNTER — Other Ambulatory Visit: Payer: Self-pay

## 2024-08-20 DIAGNOSIS — D649 Anemia, unspecified: Secondary | ICD-10-CM | POA: Diagnosis present

## 2024-08-20 LAB — COMPREHENSIVE METABOLIC PANEL WITH GFR
ALT: 26 U/L (ref 0–44)
AST: 35 U/L (ref 15–41)
Albumin: 4.6 g/dL (ref 3.5–5.0)
Alkaline Phosphatase: 73 U/L (ref 38–126)
Anion gap: 13 (ref 5–15)
BUN: 10 mg/dL (ref 6–20)
CO2: 25 mmol/L (ref 22–32)
Calcium: 9.4 mg/dL (ref 8.9–10.3)
Chloride: 104 mmol/L (ref 98–111)
Creatinine, Ser: 0.93 mg/dL (ref 0.44–1.00)
GFR, Estimated: >60 mL/min (ref >60)
Glucose, Bld: 96 mg/dL (ref 70–99)
Potassium: 3 mmol/L — ABNORMAL LOW (ref 3.5–5.1)
Sodium: 142 mmol/L (ref 135–145)
Total Bilirubin: 0.3 mg/dL (ref 0.0–1.2)
Total Protein: 7.6 g/dL (ref 6.5–8.1)

## 2024-08-20 LAB — CBC
HCT: 26.8 % — ABNORMAL LOW (ref 36.0–46.0)
Hemoglobin: 7.2 g/dL — ABNORMAL LOW (ref 12.0–15.0)
MCH: 16.7 pg — ABNORMAL LOW (ref 26.0–34.0)
MCHC: 26.9 g/dL — ABNORMAL LOW (ref 30.0–36.0)
MCV: 62 fL — ABNORMAL LOW (ref 80.0–100.0)
Platelets: 700 K/uL — ABNORMAL HIGH (ref 150–400)
RBC: 4.32 MIL/uL (ref 3.87–5.11)
RDW: 20.8 % — ABNORMAL HIGH (ref 11.5–15.5)
WBC: 7.2 K/uL (ref 4.0–10.5)
nRBC: 0 % (ref 0.0–0.2)

## 2024-08-20 LAB — URINALYSIS, MICROSCOPIC (REFLEX): RBC / HPF: >50 RBC/hpf (ref 0–5)

## 2024-08-20 LAB — MAGNESIUM: Magnesium: 1.6 mg/dL — ABNORMAL LOW (ref 1.7–2.4)

## 2024-08-20 LAB — URINALYSIS, ROUTINE W REFLEX MICROSCOPIC
Bilirubin Urine: NEGATIVE
Glucose, UA: NEGATIVE mg/dL
Ketones, ur: NEGATIVE mg/dL
Nitrite: NEGATIVE
Protein, ur: 100 mg/dL — AB
Specific Gravity, Urine: >=1.03 (ref 1.005–1.030)
pH: 6 (ref 5.0–8.0)

## 2024-08-20 LAB — PREGNANCY, URINE: Preg Test, Ur: NEGATIVE

## 2024-08-20 MED ORDER — POTASSIUM CHLORIDE CRYS ER 20 MEQ PO TBCR
40.0000 meq | EXTENDED_RELEASE_TABLET | Freq: Once | ORAL | Status: AC
  Administered 2024-08-20: 22:00:00 40 meq via ORAL
  Filled 2024-08-20: qty 2

## 2024-08-20 NOTE — Progress Notes (Signed)
 Hospitalist Transfer Note:    Nursing staff, Please call [facility] Admits & Consults System-Wide number on Amion [phone]) as soon as patient's arrival, so appropriate admitting provider can evaluate the pt.   Transferring facility: [facility] Requesting provider: Warren Shad, PA (EDP at [facility]) Reason for transfer: admission for further evaluation and management of symptomatic anemia.    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   Patient conveys a longstanding history of menorrhagia, with 10 days of menstrual flow per month.  She recently completed her menstrual cycle a few days ago, and is not currently on any blood thinners as an outpatient.  However, she notes 1 week of generalized weakness in the absence of any acute focal weakness, and notes dizziness, lightheadedness, and subjective sensation of impending loss of consciousness x 1 episode that occurred when attempting to rise from a seated to a standing position earlier today.  Was not associated with formal loss of consciousness and did not result in any fall.  Not associate with any chest pain.  Denies any recent melena, hematochezia, or any hematemesis.  She conveys that she is not currently established with OB/GYN as an outpatient, but would be interested in establishing moving forward.   Vital signs in the ED were notable for the following: Afebrile; heart rates in the 70s to 80s; systolic pressures in the 130s; respiratory rate 17-20 oxygen saturation 100% on room air.  Labs were notable for CBC which showed hemoglobin of 7.2 relative demonstration prior hemoglobin value of 7.6 on 12/08/2023.  Additionally, today's CMP showed potassium of 3.0.  magnesium level noted to be 1.6.   Subsequently, I accepted this patient for transfer for  observation to a med-tele bed at [facility] or [facility]  (first available) for further work-up and management of the above.      Eva Pore, DO Hospitalist

## 2024-08-20 NOTE — ED Provider Notes (Signed)
 [place] EMERGENCY DEPARTMENT AT [facility] Provider Note   CSN: [id number] Arrival date & time: 08/20/24  8087     Patient presents with: Dizziness   Kelli Brooks is a 43 y.o. female patient with past medical history of anemia, renal insufficiency presents emergency room with complaint of dizziness, lightheadedness and feeling short of breath when up and walking around. She notes fatigue and generalized weakness. She feels like she may pass out when she stands. She tells me she has history of heavy periods and is on her period. She is supposed to take iron  pill but has not been taking it. She does not have OBGYN or anyone she sees for heavy periods.      Dizziness      Prior to Admission medications  Medication Sig Start Date End Date Taking? Authorizing Provider  benzonatate  (TESSALON ) 100 MG capsule Take 1 capsule (100 mg total) by mouth every 8 (eight) hours. 06/06/23   Johnie Flaming A, NP  famotidine  (PEPCID ) 20 MG tablet Take 1 tablet (20 mg total) by mouth 2 (two) times daily. Take 30 minutes before breakfast and dinner 10/16/22 11/15/22  Cottie Donnice PARAS, MD  famotidine  (PEPCID ) 40 MG tablet Take 1 tablet (40 mg total) by mouth daily for 7 days. Patient not taking: Reported on 08/19/2021 01/11/21 01/18/21  Alena Barter, MD  ferrous sulfate  325 (65 FE) MG tablet Take 1 tablet (325 mg total) by mouth daily. 05/31/23   Patt Alm Macho, MD  methocarbamol  (ROBAXIN ) 500 MG tablet Take 1 tablet (500 mg total) by mouth every 8 (eight) hours as needed. 12/08/23   Long, Fonda MATSU, MD  omeprazole  (PRILOSEC) 20 MG capsule Take 1 capsule (20 mg total) by mouth daily. 10/16/22 11/15/22  Cottie Donnice PARAS, MD  permethrin  (ELIMITE ) 5 % cream Apply to affected area once 05/10/23   Curatolo, Adam, DO  potassium chloride  (KLOR-CON ) 10 MEQ tablet Take 1 tablet (10 mEq total) by mouth daily for 15 days. 10/16/22 10/31/22  Cottie Donnice PARAS, MD  promethazine -dextromethorphan  (PROMETHAZINE -DM) 6.25-15 MG/5ML syrup Take 5 mLs by mouth at bedtime as needed for cough. 06/06/23   Johnie Flaming LABOR, NP    Allergies: Patient has no known allergies.    Review of Systems  Neurological:  Positive for dizziness.    Updated Vital Signs BP 133/89 (BP Location: Right Arm)   Pulse 94   Temp 98.1 F (36.7 C) (Oral)   Resp 17   Wt 83.9 kg   SpO2 100%   BMI 33.84 kg/m   Physical Exam Vitals and nursing note reviewed.  Constitutional:      General: She is not in acute distress.    Appearance: She is not toxic-appearing.  HENT:     Head: Normocephalic and atraumatic.     Ears:     Comments: Very pale appearing.  Eyes:     General: No scleral icterus.    Conjunctiva/sclera: Conjunctivae normal.  Cardiovascular:     Rate and Rhythm: Regular rhythm. Tachycardia present.     Pulses: Normal pulses.     Heart sounds: Normal heart sounds.  Pulmonary:     Effort: Pulmonary effort is normal. No respiratory distress.     Breath sounds: Normal breath sounds.  Abdominal:     General: Abdomen is flat. Bowel sounds are normal.     Palpations: Abdomen is soft.     Tenderness: There is no abdominal tenderness.  Skin:    General: Skin is warm and  dry.     Findings: No lesion.  Neurological:     General: No focal deficit present.     Mental Status: She is alert and oriented to person, place, and time. Mental status is at baseline.     (all labs ordered are listed, but only abnormal results are displayed) Labs Reviewed  COMPREHENSIVE METABOLIC PANEL WITH GFR - Abnormal; Notable for the following components:      Result Value   Potassium 3.0 (*)    All other components within normal limits  CBC - Abnormal; Notable for the following components:   Hemoglobin 7.2 (*)    HCT 26.8 (*)    MCV 62.0 (*)    MCH 16.7 (*)    MCHC 26.9 (*)    RDW 20.8 (*)    Platelets 700 (*)    All other components within normal limits  MAGNESIUM - Abnormal; Notable for the following  components:   Magnesium 1.6 (*)    All other components within normal limits  URINALYSIS, ROUTINE W REFLEX MICROSCOPIC  PREGNANCY, URINE  CBG MONITORING, ED    EKG: None  Radiology: No results found.   .Critical Care  Performed by: Shermon Warren SAILOR, PA-C Authorized by: Shermon Warren SAILOR, PA-C   Critical care provider statement:    Critical care time (minutes):  47   Critical care was necessary to treat or prevent imminent or life-threatening deterioration of the following conditions:  Circulatory failure (Symptomatic anemia)   Critical care was time spent personally by me on the following activities:  Development of treatment plan with patient or surrogate, discussions with consultants, evaluation of patient's response to treatment, examination of patient, ordering and review of laboratory studies, ordering and review of radiographic studies, ordering and performing treatments and interventions, pulse oximetry, re-evaluation of patient's condition and review of old charts    Medications Ordered in the ED - No data to display                                  Medical Decision Making Amount and/or Complexity of Data Reviewed Labs: ordered. Radiology: ordered.  Risk Prescription drug management.   This patient presents to the ED for concern of weakness, this involves an extensive number of treatment options, and is a complaint that carries with it a high risk of complications and morbidity.  The differential diagnosis includes PE, ACS, pneumonia, dehydration, electrolyte abnormality, arrhythmia, anemia   Co morbidities that complicate the patient evaluation  History of heavy vaginal bleeding History of anemia requiring transfusion   Additional history obtained:  Additional history obtained from patient was seen here 05/31/2024   Lab Tests:  I personally interpreted labs.  The pertinent results include:   Patient's labs show a hemoglobin of 7.2, low MCV and increased  RDW hemoglobin has slightly downtrended since labs 8 months ago CMP is remarkable for potassium of 3, will orally supplement here and add on magnesium.   Imaging Studies ordered:  I ordered imaging studies including chest x-ray due to patient's reported shortness of breath   Cardiac Monitoring: / EKG:  The patient was maintained on a cardiac monitor.  I personally viewed and interpreted the cardiac monitored which showed an underlying rhythm of: Normal sinus rhythm with PVC   Consultations Obtained:  I requested consultation with the hospital team for admission,  and discussed lab and imaging findings as well as pertinent plan - they recommend:  admission, discussed with Dr Marcene    Problem List / ED Course / Critical interventions / Medication management  Patient presents to emergency room with complaint of weakness, fatigue and presyncopal feeling upon standing.  She reports that this has been ongoing for about a week but particularly bad today.  On exam she has no focal neurological deficits and her vitals are stable.  She has pale conjunctiva but moist mucous membranes.  She has no significant chest pain or shortness of breath at this time.  No swelling in feet and ankles.  Her EKG shows normal sinus rhythm.  Her lab work does show anemia at 7.2 and a potassium of 3.  I will orally replace potassium here.  Unfortunately being at med center we do not have RBC for transfusion.  I do not think she is emergently requiring transfusion given stable vital signs at this time. I do think she would benefit from admission and transfusion, will reach out to hospital team.  I ordered medication including potassium.         Final diagnoses:  Symptomatic anemia    ED Discharge Orders     None          Shermon Warren SAILOR, PA-C 08/20/24 2205    Lenor Hollering, MD 08/20/24 2321

## 2024-08-20 NOTE — ED Triage Notes (Signed)
 Pt arrives with c/o dizziness that started yesterday. Pt reports headache and SOB. Pt does have hx of anemia and needing blood transfusions for low hemoglobin.

## 2024-08-21 DIAGNOSIS — D649 Anemia, unspecified: Secondary | ICD-10-CM

## 2024-08-21 LAB — CBC
HCT: 27.1 % — ABNORMAL LOW (ref 36.0–46.0)
Hemoglobin: 7.2 g/dL — ABNORMAL LOW (ref 12.0–15.0)
MCH: 16.4 pg — ABNORMAL LOW (ref 26.0–34.0)
MCHC: 26.6 g/dL — ABNORMAL LOW (ref 30.0–36.0)
MCV: 61.7 fL — ABNORMAL LOW (ref 80.0–100.0)
Platelets: 700 K/uL — ABNORMAL HIGH (ref 150–400)
RBC: 4.39 MIL/uL (ref 3.87–5.11)
RDW: 20.5 % — ABNORMAL HIGH (ref 11.5–15.5)
WBC: 7.8 K/uL (ref 4.0–10.5)
nRBC: 0 % (ref 0.0–0.2)

## 2024-08-21 LAB — HEMOGLOBIN AND HEMATOCRIT, BLOOD
HCT: 24.8 % — ABNORMAL LOW (ref 36.0–46.0)
Hemoglobin: 6.7 g/dL — CL (ref 12.0–15.0)

## 2024-08-21 LAB — PREPARE RBC (CROSSMATCH)

## 2024-08-21 MED ORDER — ONDANSETRON HCL 4 MG PO TABS: 4.0000 mg | ORAL_TABLET | Freq: Four times a day (QID) | ORAL | Status: DC | PRN

## 2024-08-21 MED ORDER — ACETAMINOPHEN 325 MG PO TABS: 650.0000 mg | ORAL_TABLET | Freq: Four times a day (QID) | ORAL | Status: DC | PRN

## 2024-08-21 MED ORDER — ONDANSETRON HCL 4 MG/2ML IJ SOLN: 4.0000 mg | Freq: Four times a day (QID) | INTRAMUSCULAR | Status: DC | PRN

## 2024-08-21 MED ORDER — TRAZODONE HCL 50 MG PO TABS: 25.0000 mg | ORAL_TABLET | Freq: Every evening | ORAL | Status: DC | PRN

## 2024-08-21 MED ORDER — ALBUTEROL SULFATE (2.5 MG/3ML) 0.083% IN NEBU: 2.5000 mg | INHALATION_SOLUTION | RESPIRATORY_TRACT | Status: DC | PRN

## 2024-08-21 MED ORDER — ACETAMINOPHEN 650 MG RE SUPP: 650.0000 mg | Freq: Four times a day (QID) | RECTAL | Status: DC | PRN

## 2024-08-21 MED ORDER — SODIUM CHLORIDE 0.9% IV SOLUTION: Freq: Once | INTRAVENOUS | Status: DC

## 2024-08-21 MED ORDER — SODIUM CHLORIDE 0.9 % IV BOLUS
1000.0000 mL | Freq: Once | INTRAVENOUS | Status: AC
  Administered 2024-08-21: 13:00:00 1000 mL via INTRAVENOUS

## 2024-08-21 NOTE — ED Notes (Signed)
Patient given snack.  

## 2024-08-21 NOTE — H&P (Signed)
 History and Physical  Kelli Brooks FMW:[id number] DOB: 1981/08/14 DOA: 08/20/2024  PCP: Pcp, No   Chief Complaint: Weakness, dizziness, intermittent chest pain  HPI: Kelli Brooks is a 43 y.o. female with medical history significant for menorrhagia, chronic anemia and symptomatic blood loss anemia requiring blood transfusion in 2022 being admitted to the hospital with recurrent symptomatic anemia.  Patient states that she for several years has had very heavy menses, there has been no significant change.  Over the last few weeks she has had gradual onset of weakness, dyspnea with exertion, dizziness and lightheadedness.  In the last couple of days this has worsened significantly, in the last week she also mentioned that she has had intermittent chest pain, usually at rest.  It could be on either side of her chest, not pleuritic.  She denies any cough, shortness of breath at rest, or syncope.  Review of Systems: Please see HPI for pertinent positives and negatives. A complete 10 system review of systems are otherwise negative.  Past Medical History:  Diagnosis Date   Anemia    Past Surgical History:  Procedure Laterality Date   CESAREAN SECTION     Social History:  reports that she has never smoked. She has never used smokeless tobacco. She reports current alcohol use. She reports that she does not use drugs.  Allergies[1]  Family History  Problem Relation Age of Onset   Heart attack Brother 76   Heart attack Father 47     Prior to Admission medications  Medication Sig Start Date End Date Taking? Authorizing Provider  benzonatate  (TESSALON ) 100 MG capsule Take 1 capsule (100 mg total) by mouth every 8 (eight) hours. Patient not taking: Reported on 08/21/2024 06/06/23   Johnie Rumaldo LABOR, NP    Physical Exam: BP 124/77 (BP Location: Right Arm)   Pulse 76   Temp 98.5 F (36.9 C)   Resp 16   Ht 5' 2 (1.575 m)   Wt 83.9 kg   SpO2 98%   BMI 33.83 kg/m  General:   Alert, oriented, calm, in no acute distress  Eyes: EOMI, clear conjuctivae, white sclerea Neck: supple, no masses, trachea mildline  Cardiovascular: RRR, no murmurs or rubs, no peripheral edema  Respiratory: clear to auscultation bilaterally, no wheezes, no crackles  Abdomen: soft, nontender, nondistended, normal bowel tones heard  Skin: dry, no rashes  Musculoskeletal: no joint effusions, normal range of motion  Psychiatric: appropriate affect, normal speech  Neurologic: extraocular muscles intact, clear speech, moving all extremities with intact sensorium         Labs on Admission:  Basic Metabolic Panel: Recent Labs  Lab 08/20/24 1935  NA 142  K 3.0*  CL 104  CO2 25  GLUCOSE 96  BUN 10  CREATININE 0.93  CALCIUM 9.4  MG 1.6*   Liver Function Tests: Recent Labs  Lab 08/20/24 1935  AST 35  ALT 26  ALKPHOS 73  BILITOT 0.3  PROT 7.6  ALBUMIN 4.6   No results for input(s): LIPASE, AMYLASE in the last 168 hours. No results for input(s): AMMONIA in the last 168 hours. CBC: Recent Labs  Lab 08/20/24 1935 08/21/24 0935  WBC 7.2 7.8  HGB 7.2* 7.2*  HCT 26.8* 27.1*  MCV 62.0* 61.7*  PLT 700* 700*   Cardiac Enzymes: No results for input(s): CKTOTAL, CKMB, CKMBINDEX, TROPONINI in the last 168 hours. BNP (last 3 results) No results for input(s): BNP in the last 8760 hours.  ProBNP (last 3 results) No results  for input(s): PROBNP in the last 8760 hours.  CBG: No results for input(s): GLUCAP in the last 168 hours.  Radiological Exams on Admission: DG Chest Portable 1 View Result Date: 08/20/2024 EXAM: 1 VIEW(S) XRAY OF THE CHEST 08/20/2024 09:47:00 PM COMPARISON: 12/08/2023 CLINICAL HISTORY: SOB FINDINGS: LUNGS AND PLEURA: No focal pulmonary opacity. No pleural effusion. No pneumothorax. HEART AND MEDIASTINUM: No acute abnormality of the cardiac and mediastinal silhouettes. BONES AND SOFT TISSUES: No acute osseous abnormality. IMPRESSION: 1. No  acute cardiopulmonary process. Electronically signed by: Dorethia Molt MD 08/20/2024 09:58 PM EST RP Workstation: [id number]   Assessment/Plan Kelli Brooks is a 43 y.o. female with medical history significant for menorrhagia, chronic anemia and symptomatic blood loss anemia requiring blood transfusion in 2022 being admitted to the hospital with recurrent symptomatic anemia.  Blood loss anemia-due to history of menorrhagia.  Patient has not followed up with OB/GYN, something she was advised to do after her hospitalization requiring blood transfusion in March 2022. -Observation admission -Trend hemoglobin, and transfuse as indicated  Menorrhagia-patient was advised to follow-up as an outpatient with gynecology for definitive management  Hypokalemia-repleted in the ER, will recheck in the morning  DVT prophylaxis: SCDs only    Code Status: Full Code  Consults called: None  Admission status: Observation  Time spent: 25 minutes  Herminia Warren CHRISTELLA Gail MD [facility] Pager [phone]  If 7PM-7AM, please contact night-coverage [email or web address] Password [email or web address]  08/21/2024, 12:55 PM      [1] No Known Allergies

## 2024-08-21 NOTE — Plan of Care (Signed)
   Problem: Education: Goal: Knowledge of General Education information will improve Description: Including pain rating scale, medication(s)/side effects and non-pharmacologic comfort measures Outcome: Progressing   Problem: Health Behavior/Discharge Planning: Goal: Ability to manage health-related needs will improve Outcome: Progressing   Problem: Clinical Measurements: Goal: Ability to maintain clinical measurements within normal limits will improve Outcome: Progressing Goal: Will remain free from infection Outcome: Progressing Goal: Cardiovascular complication will be avoided Outcome: Progressing   Problem: Activity: Goal: Risk for activity intolerance will decrease Outcome: Progressing   Problem: Nutrition: Goal: Adequate nutrition will be maintained Outcome: Progressing   Problem: Coping: Goal: Level of anxiety will decrease Outcome: Progressing

## 2024-08-21 NOTE — ED Notes (Signed)
 ED TO INPATIENT HANDOFF REPORT  ED Nurse Name and Phone #: Thornell Herring, RN  S Name/Age/Gender Kelli Brooks 43 y.o. female Room/Bed: MH11/MH11  Code Status   Code Status: Prior  Home/SNF/Other Home Patient oriented to: self, place, time, and situation Is this baseline? Yes   Triage Complete: Triage complete  Chief Complaint Symptomatic anemia [D64.9]  Triage Note Pt arrives with c/o dizziness that started yesterday. Pt reports headache and SOB. Pt does have hx of anemia and needing blood transfusions for low hemoglobin.    Allergies Allergies[1]  Level of Care/Admitting Diagnosis ED Disposition     ED Disposition  Admit   Condition  --   Comment  Hospital Area: [facility] [[id number]]  Level of Care: Telemetry [5]  Admit to tele based on following criteria: Monitor for Ischemic changes  Interfacility transfer: Yes  May place patient in observation at [facility] or [facility] if equivalent level of care is available:: Yes  Diagnosis: Symptomatic anemia [8671310]  Admitting Physician: HOWERTER, JUSTIN B [[id number]]  Attending Physician: HOWERTER, JUSTIN B [[id number]]          B Medical/Surgery History Past Medical History:  Diagnosis Date   Anemia    Past Surgical History:  Procedure Laterality Date   CESAREAN SECTION       A IV Location/Drains/Wounds Patient Lines/Drains/Airways Status     Active Line/Drains/Airways     Name Placement date Placement time Site Days   Peripheral IV 08/21/24 20 G Anterior;Distal;Left;Upper Antecubital 08/21/24  0933  Antecubital  less than 1            Intake/Output Last 24 hours No intake or output data in the 24 hours ending 08/21/24 1039  Labs/Imaging Results for orders placed or performed during the hospital encounter of 08/20/24 (from the past 48 hours)  Comprehensive metabolic panel     Status: Abnormal   Collection Time: 08/20/24  7:35 PM  Result Value Ref Range   Sodium 142  135 - 145 mmol/L   Potassium 3.0 (L) 3.5 - 5.1 mmol/L   Chloride 104 98 - 111 mmol/L   CO2 25 22 - 32 mmol/L   Glucose, Bld 96 70 - 99 mg/dL    Comment: Glucose reference range applies only to samples taken after fasting for at least 8 hours.   BUN 10 6 - 20 mg/dL   Creatinine, Ser 9.06 0.44 - 1.00 mg/dL   Calcium 9.4 8.9 - 89.6 mg/dL   Total Protein 7.6 6.5 - 8.1 g/dL   Albumin 4.6 3.5 - 5.0 g/dL   AST 35 15 - 41 U/L   ALT 26 0 - 44 U/L   Alkaline Phosphatase 73 38 - 126 U/L   Total Bilirubin 0.3 0.0 - 1.2 mg/dL   GFR, Estimated >39 >39 mL/min    Comment: (NOTE) Calculated using the CKD-EPI Creatinine Equation (2021)    Anion gap 13 5 - 15    Comment: Performed at [facility], [address] [place], [place] [address]  CBC     Status: Abnormal   Collection Time: 08/20/24  7:35 PM  Result Value Ref Range   WBC 7.2 4.0 - 10.5 K/uL   RBC 4.32 3.87 - 5.11 MIL/uL   Hemoglobin 7.2 (L) 12.0 - 15.0 g/dL   HCT 73.1 (L) 63.9 - 53.9 %   MCV 62.0 (L) 80.0 - 100.0 fL   MCH 16.7 (L) 26.0 - 34.0 pg   MCHC 26.9 (L) 30.0 -  36.0 g/dL   RDW 79.1 (H) 88.4 - 84.4 %   Platelets 700 (H) 150 - 400 K/uL    Comment: REPEATED TO VERIFY   nRBC 0.0 0.0 - 0.2 %    Comment: Performed at [facility], [address] [place], [place] [address]  Magnesium     Status: Abnormal   Collection Time: 08/20/24  7:35 PM  Result Value Ref Range   Magnesium 1.6 (L) 1.7 - 2.4 mg/dL    Comment: Performed at [facility], [address] [place], [place] [address]  Urinalysis, Routine w reflex microscopic -Urine, Clean Catch     Status: Abnormal   Collection Time: 08/20/24 10:44 PM  Result Value Ref Range   Color, Urine YELLOW YELLOW   APPearance CLOUDY (A) CLEAR   Specific Gravity, Urine >=1.030 1.005 - 1.030   pH 6.0 5.0 - 8.0   Glucose, UA NEGATIVE NEGATIVE mg/dL   Hgb urine dipstick LARGE (A) NEGATIVE   Bilirubin Urine NEGATIVE NEGATIVE   Ketones, ur NEGATIVE NEGATIVE  mg/dL   Protein, ur 899 (A) NEGATIVE mg/dL   Nitrite NEGATIVE NEGATIVE   Leukocytes,Ua SMALL (A) NEGATIVE    Comment: Performed at [facility], [address] [place], [place] [address]  Pregnancy, urine     Status: None   Collection Time: 08/20/24 10:44 PM  Result Value Ref Range   Preg Test, Ur NEGATIVE NEGATIVE    Comment:        THE SENSITIVITY OF THIS METHODOLOGY IS >20 mIU/mL. Performed at [facility], [address] [place], [place] [address]   Urinalysis, Microscopic (reflex)     Status: Abnormal   Collection Time: 08/20/24 10:44 PM  Result Value Ref Range   RBC / HPF >50 0 - 5 RBC/hpf   WBC, UA 21-50 0 - 5 WBC/hpf   Bacteria, UA FEW (A) NONE SEEN   Squamous Epithelial / HPF 0-5 0 - 5 /HPF   Mucus PRESENT    Trichomonas, UA PRESENT (A) NONE SEEN   Hyaline Casts, UA PRESENT    Urine-Other LESS THAN 10 mL OF URINE SUBMITTED     Comment: Performed at [facility], [address] [place], [place] [address]  CBC     Status: Abnormal   Collection Time: 08/21/24  9:35 AM  Result Value Ref Range   WBC 7.8 4.0 - 10.5 K/uL   RBC 4.39 3.87 - 5.11 MIL/uL   Hemoglobin 7.2 (L) 12.0 - 15.0 g/dL    Comment: REPEATED TO VERIFY Reticulocyte Hemoglobin testing may be clinically indicated, consider ordering this additional test [id number]    HCT 27.1 (L) 36.0 - 46.0 %   MCV 61.7 (L) 80.0 - 100.0 fL   MCH 16.4 (L) 26.0 - 34.0 pg   MCHC 26.6 (L) 30.0 - 36.0 g/dL   RDW 79.4 (H) 88.4 - 84.4 %   Platelets 700 (H) 150 - 400 K/uL   nRBC 0.0 0.0 - 0.2 %    Comment: Performed at [facility], [address] [place], [place] [address]   DG Chest Portable 1 View Result Date: 08/20/2024 EXAM: 1 VIEW(S) XRAY OF THE CHEST 08/20/2024 09:47:00 PM COMPARISON: 12/08/2023 CLINICAL HISTORY: SOB FINDINGS: LUNGS AND PLEURA: No focal pulmonary opacity. No pleural effusion. No pneumothorax. HEART AND MEDIASTINUM: No acute abnormality of the cardiac and  mediastinal silhouettes. BONES AND SOFT TISSUES: No acute osseous abnormality. IMPRESSION:  1. No acute cardiopulmonary process. Electronically signed by: Dorethia Molt MD 08/20/2024 09:58 PM EST RP Workstation: [id number]    Pending Labs Unresulted Labs (From admission, onward)    None       Vitals/Pain Today's Vitals   08/21/24 0801 08/21/24 0900 08/21/24 1000 08/21/24 1001  BP:  139/83 135/88   Pulse:  95 88   Resp:  19 (!) 21   Temp: 98.2 F (36.8 C)     TempSrc: Oral     SpO2:  100% 98%   Weight:      Height:      PainSc:   0-No pain 0-No pain    Isolation Precautions No active isolations  Medications Medications  potassium chloride  SA (KLOR-CON  M) CR tablet 40 mEq (40 mEq Oral Given 08/20/24 2227)    Mobility walks     Focused Assessments    R Recommendations: See Admitting Provider Note  Report given to: Odella, RN  Additional Notes:       [1] No Known Allergies

## 2024-08-22 LAB — VITAMIN B12: Vitamin B-12: 948 pg/mL — ABNORMAL HIGH (ref 180–914)

## 2024-08-22 LAB — BASIC METABOLIC PANEL WITH GFR
Anion gap: 10 (ref 5–15)
BUN: 8 mg/dL (ref 6–20)
CO2: 23 mmol/L (ref 22–32)
Calcium: 8.7 mg/dL — ABNORMAL LOW (ref 8.9–10.3)
Chloride: 107 mmol/L (ref 98–111)
Creatinine, Ser: 0.92 mg/dL (ref 0.44–1.00)
GFR, Estimated: >60 mL/min (ref >60)
Glucose, Bld: 87 mg/dL (ref 70–99)
Potassium: 3.1 mmol/L — ABNORMAL LOW (ref 3.5–5.1)
Sodium: 140 mmol/L (ref 135–145)

## 2024-08-22 LAB — HIV ANTIBODY (ROUTINE TESTING W REFLEX): HIV Screen 4th Generation wRfx: NONREACTIVE

## 2024-08-22 LAB — CBC
HCT: 26.6 % — ABNORMAL LOW (ref 36.0–46.0)
Hemoglobin: 7.5 g/dL — ABNORMAL LOW (ref 12.0–15.0)
MCH: 18.2 pg — ABNORMAL LOW (ref 26.0–34.0)
MCHC: 28.2 g/dL — ABNORMAL LOW (ref 30.0–36.0)
MCV: 64.4 fL — ABNORMAL LOW (ref 80.0–100.0)
Platelets: 536 K/uL — ABNORMAL HIGH (ref 150–400)
RBC: 4.13 MIL/uL (ref 3.87–5.11)
RDW: 21.3 % — ABNORMAL HIGH (ref 11.5–15.5)
WBC: 5.8 K/uL (ref 4.0–10.5)
nRBC: 0 % (ref 0.0–0.2)

## 2024-08-22 LAB — IRON AND TIBC
Iron: 334 ug/dL — ABNORMAL HIGH (ref 28–170)
Saturation Ratios: 70 % — ABNORMAL HIGH (ref 10.4–31.8)
TIBC: 479 ug/dL — ABNORMAL HIGH (ref 250–450)
UIBC: 145 ug/dL

## 2024-08-22 LAB — RETICULOCYTES
Immature Retic Fract: 32 % — ABNORMAL HIGH (ref 2.3–15.9)
RBC.: 4.36 MIL/uL (ref 3.87–5.11)
Retic Count, Absolute: 58.9 K/uL (ref 19.0–186.0)
Retic Ct Pct: 1.4 % (ref 0.4–3.1)

## 2024-08-22 LAB — FOLATE: Folate: 8.4 ng/mL (ref >5.9)

## 2024-08-22 LAB — FERRITIN: Ferritin: 8 ng/mL — ABNORMAL LOW (ref 11–307)

## 2024-08-22 MED ORDER — ACETAMINOPHEN 325 MG PO TABS: 650.0000 mg | ORAL_TABLET | Freq: Four times a day (QID) | ORAL | Status: AC | PRN

## 2024-08-22 MED ORDER — POTASSIUM CHLORIDE CRYS ER 20 MEQ PO TBCR
40.0000 meq | EXTENDED_RELEASE_TABLET | Freq: Once | ORAL | Status: AC
  Administered 2024-08-22: 16:00:00 40 meq via ORAL
  Filled 2024-08-22: qty 2

## 2024-08-22 MED ORDER — IRON SUCROSE 200 MG IVPB - SIMPLE MED
200.0000 mg | Freq: Once | Status: AC
  Administered 2024-08-22: 11:00:00 200 mg via INTRAVENOUS
  Filled 2024-08-22: qty 200

## 2024-08-22 NOTE — Assessment & Plan Note (Addendum)
 Due to history of menorrhagia Patient has not followed up with OB/GYN, something she was advised to do after her hospitalization requiring blood transfusion in March 2022; she reports not having insurance Observed overnight Transfused with improvement in symptoms Markedly low ferritin Will give IV iron  x 1 and then refer to pharmacy for outpatient f/u

## 2024-08-22 NOTE — Assessment & Plan Note (Addendum)
 Repleted.

## 2024-08-22 NOTE — Assessment & Plan Note (Addendum)
 Patient was advised to follow-up as an outpatient with gynecology for definitive management  She has 4 children but is not yet ready to surrender future fertility, which is likely to limit her options for management Will place outpatient urgent GYN referral She is NOT currently menstruating

## 2024-08-22 NOTE — Discharge Summary (Signed)
 Physician Discharge Summary   Patient: Kelli Brooks MRN: [id number] DOB: 17-Oct-1980  Admit date:     08/20/2024  Discharge date: 08/22/2024  Discharge Physician: Delon Herald   PCP: Pcp, No   Recommendations at discharge:   You are being referred to GYN as an outpatient; follow up prior to next menses if at all possible to discuss treatment options You are being referred for IV iron  as an outpatient Follow up with PCP (reports that she goes to a community clinic) Attempt to apply for Medicaid  Discharge Diagnoses: Principal Problem:   Symptomatic anemia Active Problems:   Menorrhagia   Hypokalemia    Hospital Course: 43yo with h/o menorrhagia with associated chronic anemia who presented on 12/16 with generalized weakness and dizziness.  She was observed for symptomatic anemia and transfused 1 unit PRBC.  She will need close outpatient GYN follow up.  Assessment and Plan:  Assessment & Plan Symptomatic anemia Due to history of menorrhagia Patient has not followed up with OB/GYN, something she was advised to do after her hospitalization requiring blood transfusion in March 2022; she reports not having insurance Observed overnight Transfused with improvement in symptoms Markedly low ferritin Will give IV iron  x 1 and then refer to pharmacy for outpatient f/u Menorrhagia Patient was advised to follow-up as an outpatient with gynecology for definitive management  She has 4 children but is not yet ready to surrender future fertility, which is likely to limit her options for management Will place outpatient urgent GYN referral She is NOT currently menstruating Hypokalemia Repleted    Pain control - [facility]  Controlled Substance Reporting System database was reviewed. and patient was instructed, not to drive, operate heavy machinery, perform activities at heights, swimming or participation in water activities or provide baby-sitting services while on Pain, Sleep  and Anxiety Medications; until their outpatient Physician has advised to do so again. Also recommended to not to take more than prescribed Pain, Sleep and Anxiety Medications.   Disposition: Home Diet recommendation:  Regular diet DISCHARGE MEDICATION: Allergies as of 08/22/2024   No Known Allergies      Medication List     STOP taking these medications    benzonatate  100 MG capsule Commonly known as: TESSALON        TAKE these medications    acetaminophen  325 MG tablet Commonly known as: TYLENOL  Take 2 tablets (650 mg total) by mouth every 6 (six) hours as needed for mild pain (pain score 1-3) or fever (or Fever >/= 101).        Discharge Exam:   Subjective: Still a little dizzy/unsteady but feels much better and wants to go home today.   Objective: Vitals:   08/22/24 0345 08/22/24 0558  BP: 137/89 137/85  Pulse: 72 79  Resp: 17 18  Temp: 98.2 F (36.8 C) 98.2 F (36.8 C)  SpO2: 100% 97%    Intake/Output Summary (Last 24 hours) at 08/22/2024 1534 Last data filed at 08/22/2024 1318 Gross per 24 hour  Intake 1564.14 ml  Output --  Net 1564.14 ml   Filed Weights   08/20/24 1924 08/20/24 2230  Weight: 83.9 kg 83.9 kg    Exam:  General:  Appears calm and comfortable and is in NAD Eyes:  normal lids, iris ENT:  grossly normal hearing, lips & tongue, mmm Cardiovascular:  RRR. No LE edema.  Respiratory:   CTA bilaterally with no wheezes/rales/rhonchi.  Normal respiratory effort. Abdomen:  soft, NT, ND Skin:  no rash or induration seen  on limited exam Musculoskeletal:  grossly normal tone BUE/BLE, good ROM, no bony abnormality Psychiatric:  grossly normal mood and affect, speech fluent and appropriate, AOx3 Neurologic:  CN 2-12 grossly intact, moves all extremities in coordinated fashion  Data Reviewed: I have reviewed the patient's lab results since admission.  Pertinent labs for today include:   WBC 5.8 Hgb 7.5, improved from 7.2 MCV  64.4 Platelets 536, improved from 702    Condition at discharge: improving  The results of significant diagnostics from this hospitalization (including imaging, microbiology, ancillary and laboratory) are listed below for reference.   Imaging Studies: DG Chest Portable 1 View Result Date: 08/20/2024 EXAM: 1 VIEW(S) XRAY OF THE CHEST 08/20/2024 09:47:00 PM COMPARISON: 12/08/2023 CLINICAL HISTORY: SOB FINDINGS: LUNGS AND PLEURA: No focal pulmonary opacity. No pleural effusion. No pneumothorax. HEART AND MEDIASTINUM: No acute abnormality of the cardiac and mediastinal silhouettes. BONES AND SOFT TISSUES: No acute osseous abnormality. IMPRESSION: 1. No acute cardiopulmonary process. Electronically signed by: Dorethia Molt MD 08/20/2024 09:58 PM EST RP Workstation: [id number]    Microbiology: Results for orders placed or performed during the hospital encounter of 05/31/23  SARS Coronavirus 2 by RT PCR (hospital order, performed in [facility] hospital lab) *cepheid single result test* Anterior Nasal Swab     Status: None   Collection Time: 05/31/23  9:01 AM   Specimen: Anterior Nasal Swab  Result Value Ref Range Status   SARS Coronavirus 2 by RT PCR NEGATIVE NEGATIVE Final    Comment: Performed at [facility], [address], [place], [place] [address]    Labs: CBC: Recent Labs  Lab 08/20/24 1935 08/21/24 0935 08/21/24 1539 08/22/24 0706  WBC 7.2 7.8  --  5.8  HGB 7.2* 7.2* 6.7* 7.5*  HCT 26.8* 27.1* 24.8* 26.6*  MCV 62.0* 61.7*  --  64.4*  PLT 700* 700*  --  536*   Basic Metabolic Panel: Recent Labs  Lab 08/20/24 1935 08/22/24 0706  NA 142 140  K 3.0* 3.1*  CL 104 107  CO2 25 23  GLUCOSE 96 87  BUN 10 8  CREATININE 0.93 0.92  CALCIUM 9.4 8.7*  MG 1.6*  --    Liver Function Tests: Recent Labs  Lab 08/20/24 1935  AST 35  ALT 26  ALKPHOS 73  BILITOT 0.3  PROT 7.6  ALBUMIN 4.6   CBG: No results for input(s): GLUCAP in the last 168 hours.  Discharge  time spent: greater than 30 minutes.  Signed: Delon Herald, MD [facility] 08/22/2024

## 2024-08-22 NOTE — Hospital Course (Signed)
 43yo with h/o menorrhagia with associated chronic anemia who presented on 12/16 with generalized weakness and dizziness.  She was observed for symptomatic anemia and transfused 1 unit PRBC.  She will need close outpatient GYN follow up.

## 2024-08-22 NOTE — Progress Notes (Signed)
°   08/22/24 1103  TOC Brief Assessment  Insurance and Status Lapsed  Patient has primary care physician No  Home environment has been reviewed From home  Prior level of function: Independent  Prior/Current Home Services No current home services  Social Drivers of Health Review SDOH reviewed needs interventions (No PCP, no insuracne)  Readmission risk has been reviewed Yes  Transition of care needs transition of care needs identified, TOC will continue to follow

## 2024-08-22 NOTE — Plan of Care (Signed)
   Problem: Education: Goal: Knowledge of General Education information will improve Description: Including pain rating scale, medication(s)/side effects and non-pharmacologic comfort measures Outcome: Progressing   Problem: Health Behavior/Discharge Planning: Goal: Ability to manage health-related needs will improve Outcome: Progressing   Problem: Clinical Measurements: Goal: Will remain free from infection Outcome: Progressing Goal: Diagnostic test results will improve Outcome: Progressing Goal: Respiratory complications will improve Outcome: Progressing   Problem: Activity: Goal: Risk for activity intolerance will decrease Outcome: Progressing

## 2024-08-23 LAB — BPAM RBC
Blood Product Expiration Date: 202601152359
ISSUE DATE / TIME: 202512180037
Unit Type and Rh: 202601152359
Unit Type and Rh: 5100

## 2024-08-23 LAB — TYPE AND SCREEN
ABO/RH(D): O POS
Antibody Screen: NEGATIVE
Donor AG Type: NEGATIVE
Unit division: 0

## 2024-09-02 NOTE — Progress Notes (Incomplete)
" ° °  43 y.o. No obstetric history on file. female here for vaginal bleeding. Married.  No LMP recorded.    She reports ***. Urine sample provided: ***  Birth control: *** Last mammogram: *** Sexually active: ***    GYN HISTORY: ***  OB History  No obstetric history on file.   Past Medical History:  Diagnosis Date   Anemia    Past Surgical History:  Procedure Laterality Date   CESAREAN SECTION     Medications Ordered Prior to Encounter[1] Allergies[2]    PE There were no vitals filed for this visit. There is no height or weight on file to calculate BMI.  Physical Exam    Assessment and Plan:        There are no diagnoses linked to this encounter.  Kelli FORBES Pa, CMA      [1]  Current Outpatient Medications on File Prior to Visit  Medication Sig Dispense Refill   acetaminophen  (TYLENOL ) 325 MG tablet Take 2 tablets (650 mg total) by mouth every 6 (six) hours as needed for mild pain (pain score 1-3) or fever (or Fever >/= 101).     No current facility-administered medications on file prior to visit.  [2] No Known Allergies  "

## 2024-09-03 ENCOUNTER — Ambulatory Visit: Payer: Self-pay | Admitting: Obstetrics and Gynecology

## 2024-09-05 ENCOUNTER — Emergency Department (HOSPITAL_COMMUNITY): Payer: Self-pay

## 2024-09-05 ENCOUNTER — Emergency Department (HOSPITAL_COMMUNITY)
Admission: EM | Admit: 2024-09-05 | Discharge: 2024-09-06 | Disposition: A | Discharge status: Home / Self Care | Payer: Self-pay

## 2024-09-05 DIAGNOSIS — N3000 Acute cystitis without hematuria: Secondary | ICD-10-CM | POA: Insufficient documentation

## 2024-09-05 DIAGNOSIS — R079 Chest pain, unspecified: Secondary | ICD-10-CM

## 2024-09-05 DIAGNOSIS — A599 Trichomoniasis, unspecified: Secondary | ICD-10-CM

## 2024-09-05 DIAGNOSIS — A5901 Trichomonal vulvovaginitis: Secondary | ICD-10-CM | POA: Insufficient documentation

## 2024-09-05 DIAGNOSIS — E876 Hypokalemia: Secondary | ICD-10-CM | POA: Insufficient documentation

## 2024-09-05 DIAGNOSIS — R0789 Other chest pain: Secondary | ICD-10-CM | POA: Insufficient documentation

## 2024-09-05 DIAGNOSIS — D649 Anemia, unspecified: Secondary | ICD-10-CM | POA: Insufficient documentation

## 2024-09-05 LAB — URINALYSIS, ROUTINE W REFLEX MICROSCOPIC
Bilirubin Urine: NEGATIVE
Glucose, UA: NEGATIVE mg/dL
Hgb urine dipstick: NEGATIVE
Ketones, ur: NEGATIVE mg/dL
Nitrite: NEGATIVE
Protein, ur: NEGATIVE mg/dL
Specific Gravity, Urine: 1.015 (ref 1.005–1.030)
WBC, UA: >50 WBC/hpf (ref 0–5)
pH: 5 (ref 5.0–8.0)

## 2024-09-05 LAB — CBC
HCT: 37.6 % (ref 36.0–46.0)
Hemoglobin: 10.7 g/dL — ABNORMAL LOW (ref 12.0–15.0)
MCH: 20.6 pg — ABNORMAL LOW (ref 26.0–34.0)
MCHC: 28.5 g/dL — ABNORMAL LOW (ref 30.0–36.0)
MCV: 72.3 fL — ABNORMAL LOW (ref 80.0–100.0)
Platelets: 307 K/uL (ref 150–400)
RBC: 5.2 MIL/uL — ABNORMAL HIGH (ref 3.87–5.11)
RDW: 31.6 % — ABNORMAL HIGH (ref 11.5–15.5)
WBC: 6.1 K/uL (ref 4.0–10.5)
nRBC: 0 % (ref 0.0–0.2)

## 2024-09-05 LAB — HEPATIC FUNCTION PANEL
ALT: 18 U/L (ref 0–44)
AST: 24 U/L (ref 15–41)
Albumin: 4.3 g/dL (ref 3.5–5.0)
Alkaline Phosphatase: 63 U/L (ref 38–126)
Bilirubin, Direct: 0.1 mg/dL (ref 0.0–0.2)
Indirect Bilirubin: 0.3 mg/dL (ref 0.3–0.9)
Total Bilirubin: 0.4 mg/dL (ref 0.0–1.2)
Total Protein: 7.3 g/dL (ref 6.5–8.1)

## 2024-09-05 LAB — BASIC METABOLIC PANEL WITH GFR
Anion gap: 12 (ref 5–15)
BUN: 7 mg/dL (ref 6–20)
CO2: 22 mmol/L (ref 22–32)
Calcium: 9.4 mg/dL (ref 8.9–10.3)
Chloride: 107 mmol/L (ref 98–111)
Creatinine, Ser: 1 mg/dL (ref 0.44–1.00)
GFR, Estimated: >60 mL/min
Glucose, Bld: 93 mg/dL (ref 70–99)
Potassium: 3.4 mmol/L — ABNORMAL LOW (ref 3.5–5.1)
Sodium: 140 mmol/L (ref 135–145)

## 2024-09-05 LAB — TROPONIN T, HIGH SENSITIVITY: Troponin T High Sensitivity: <15 ng/L (ref 0–19)

## 2024-09-05 LAB — HCG, QUANTITATIVE, PREGNANCY: hCG, Beta Chain, Quant, S: <1 m[IU]/mL

## 2024-09-05 LAB — LIPASE, BLOOD: Lipase: 34 U/L (ref 11–51)

## 2024-09-05 MED ORDER — CEFPODOXIME PROXETIL 200 MG PO TABS: 200.0000 mg | ORAL_TABLET | Freq: Two times a day (BID) | ORAL | 0 refills | Status: AC

## 2024-09-05 MED ORDER — ALUM & MAG HYDROXIDE-SIMETH 200-200-20 MG/5ML PO SUSP
30.0000 mL | Freq: Once | ORAL | Status: AC
  Administered 2024-09-05: 30 mL via ORAL
  Filled 2024-09-05: qty 30

## 2024-09-05 MED ORDER — METRONIDAZOLE 500 MG PO TABS: 500.0000 mg | ORAL_TABLET | Freq: Two times a day (BID) | ORAL | 0 refills | Status: AC

## 2024-09-05 NOTE — Discharge Instructions (Signed)
 You were seen in the ER today for evaluation of your symptoms.  I have sent a referral for cardiology follow-up.  Please make sure you follow-up with them.  Additionally, it was clear for that you have trichomoniasis which is a sexually transmitted disease.  You will need to complete treatment with an antibiotic called Flagyl with this.  Your partners will need to be tested and treated as well.  If not, you can reinfect yourself.  Additionally, your urine appears infected so I have prescribed an additional antibiotic to take for this.  Please make sure you complete the antibiotics as prescribed and take the entirety of the course.  I have included some additional information into the discharge report for you to review.  If you have any concerns, new or worsening symptoms, please return to your nearest emergency department for evaluation.  Contact a health care provider if: Your chest pain does not go away. You feel depressed. You have a fever. You notice changes in your symptoms or develop new symptoms. Get help right away if: Your chest pain gets worse. You have a cough that gets worse, or you cough up blood. You have severe pain in your abdomen. You faint. You have sudden, unexplained chest discomfort. You have sudden, unexplained discomfort in your arms, back, neck, or jaw. You have shortness of breath at any time. You suddenly start to sweat, or your skin gets clammy. You feel nausea or you vomit. You suddenly feel lightheaded or dizzy. You have severe weakness, or unexplained weakness or fatigue. Your heart begins to beat quickly, or it feels like it is skipping beats. These symptoms may represent a serious problem that is an emergency. Do not wait to see if the symptoms will go away. Get medical help right away. Call your local emergency services (911 in the [place] Do not drive yourself to the hospital.

## 2024-09-05 NOTE — ED Notes (Signed)
 Called lab to inquire about labs lab stated they never received labs but when I asked again they stated they found them

## 2024-09-05 NOTE — ED Provider Notes (Signed)
 " [place] EMERGENCY DEPARTMENT AT [facility] Provider Note   CSN: [id number] Arrival date & time: 09/05/24  1550     Patient presents with: Nausea   Kelli Brooks is a 44 y.o. female with h/o anemia presents to the ER today for evaluation of central chest pain, fatigue, and generalized weakness for 2 weeks. She reports that it is more chest pressure and is present with or without exertion, but is intermittent. Not radiating. She reports that occasionally, she will feel lightheaded, but no syncope. Reports some rhinorrhea for the past few days. Denies any cough, nasal congestion, abdominal pain, dysuria, hematuria, vaginal discharge or vaginal bleeding, fevers, leg swelling.  Denies any such as hormone, trauma, known hypercoagulable disorder, history of cancer, history of DVT/PE, unilateral leg swelling. The patient reports that she had one episode of vomiting this morning after eating cereal which isn't atypical for her.  Family history of father with cardiac stent that he got in his 2s and 19s, mother with hypertension.  No cardiac family deaths before the age of 22.  She is on iron  and multivitamin.  No other drug use.  Near daily alcohol use.  HPI     Prior to Admission medications  Medication Sig Start Date End Date Taking? Authorizing Provider  ferrous sulfate  325 (65 FE) MG tablet Take 325 mg by mouth daily with breakfast.   Yes [provider]  Multiple Vitamin (MULTIVITAMIN) tablet Take 1 tablet by mouth daily with breakfast.   Yes [provider]  acetaminophen  (TYLENOL ) 325 MG tablet Take 2 tablets (650 mg total) by mouth every 6 (six) hours as needed for mild pain (pain score 1-3) or fever (or Fever >/= 101). Patient not taking: Reported on 09/05/2024 08/22/24   Barbarann Nest, MD    Allergies: Patient has no known allergies.    Review of Systems  Constitutional:  Positive for fatigue. Negative for chills and fever.  HENT:  Positive  for rhinorrhea. Negative for congestion.   Respiratory:  Negative for cough.   Cardiovascular:  Positive for chest pain. Negative for palpitations and leg swelling.  Gastrointestinal:  Positive for vomiting. Negative for abdominal pain, constipation, diarrhea and nausea.  Genitourinary:  Negative for dysuria, flank pain, frequency, pelvic pain, urgency, vaginal bleeding, vaginal discharge and vaginal pain.  Musculoskeletal:  Negative for gait problem.  Neurological:  Positive for weakness. Negative for dizziness, speech difficulty and headaches.    Updated Vital Signs BP (!) 141/96   Pulse 75   Temp 97.9 F (36.6 C) (Oral)   Resp (!) 22   Ht 5' 2 (1.575 m)   Wt 83.9 kg   LMP 08/15/2024   SpO2 98%   BMI 33.84 kg/m   Physical Exam Vitals and nursing note reviewed.  Constitutional:      General: She is not in acute distress.    Appearance: She is not ill-appearing or toxic-appearing.     Comments: On phone in no acute distress.   Eyes:     General: No scleral icterus. Cardiovascular:     Rate and Rhythm: Normal rate.     Pulses:          Radial pulses are 2+ on the right side and 2+ on the left side.       Dorsalis pedis pulses are 2+ on the right side and 2+ on the left side.       Posterior tibial pulses are 2+ on the right side and 2+ on  the left side.  Pulmonary:     Effort: Pulmonary effort is normal. No respiratory distress.     Breath sounds: Normal breath sounds.  Abdominal:     Palpations: Abdomen is soft.     Tenderness: There is no abdominal tenderness. There is no guarding or rebound.  Skin:    General: Skin is warm and dry.  Neurological:     General: No focal deficit present.     Mental Status: She is alert.     GCS: GCS eye subscore is 4. GCS verbal subscore is 5. GCS motor subscore is 6.     Motor: No weakness.     (all labs ordered are listed, but only abnormal results are displayed) Labs Reviewed  CBC - Abnormal; Notable for the following  components:      Result Value   RBC 5.20 (*)    Hemoglobin 10.7 (*)    MCV 72.3 (*)    MCH 20.6 (*)    MCHC 28.5 (*)    RDW 31.6 (*)    All other components within normal limits  BASIC METABOLIC PANEL WITH GFR - Abnormal; Notable for the following components:   Potassium 3.4 (*)    All other components within normal limits  URINALYSIS, ROUTINE W REFLEX MICROSCOPIC - Abnormal; Notable for the following components:   APPearance HAZY (*)    Leukocytes,Ua LARGE (*)    Bacteria, UA MANY (*)    Trichomonas, UA PRESENT (*)    All other components within normal limits  WET PREP, GENITAL  HCG, QUANTITATIVE, PREGNANCY  LIPASE, BLOOD  HEPATIC FUNCTION PANEL  GC/CHLAMYDIA PROBE AMP ([place]) NOT AT [facility]  TROPONIN T, HIGH SENSITIVITY    EKG: EKG Interpretation Date/Time:  Thursday September 05 2024 19:30:25 EST Ventricular Rate:  66 PR Interval:  153 QRS Duration:  85 QT Interval:  422 QTC Calculation: 443 R Axis:   7  Text Interpretation: Sinus rhythm Borderline T abnormalities, anterior leads Confirmed by Ula Barter [id number]) on 09/05/2024 7:39:02 PM  Radiology: DG Chest 2 View Result Date: 09/05/2024 EXAM: 2 VIEW(S) XRAY OF THE CHEST 09/05/2024 07:44:00 PM COMPARISON: 08/20/2024 CLINICAL HISTORY: CHEST PAIN FINDINGS: LUNGS AND PLEURA: No focal pulmonary opacity. No pleural effusion. No pneumothorax. HEART AND MEDIASTINUM: No acute abnormality of the cardiac and mediastinal silhouettes. BONES AND SOFT TISSUES: No acute osseous abnormality. IMPRESSION: 1. No acute cardiopulmonary abnormality. Electronically signed by: Greig Pique MD 09/05/2024 07:46 PM EST RP Workstation: [id number]   Procedures   Medications Ordered in the ED  alum & mag hydroxide-simeth (MAALOX/MYLANTA) 200-200-20 MG/5ML suspension 30 mL (30 mLs Oral Given 09/05/24 2012)    Medical Decision Making Amount and/or Complexity of Data Reviewed Labs: ordered. Radiology: ordered.  Risk OTC drugs. Prescription drug  management.   44 y.o. female presents to the ER for evaluation of chest pain/fatigue. Differential diagnosis includes but is not limited to ACS, pericarditis, myocarditis, aortic dissection, PE, pneumothorax, esophageal rupture, pneumonia, reflux/PUD, biliary disease, pancreatitis, costochondritis, anxiety. Vital signs mild elevated BP otherwise unremarkable. Physical exam as noted above.   Work- up initiated in triage. The patient reports that she is here for chest pain and fatigue, not nausea. Will add on labs. On previous chart evaluation, it does appear the patient was seen recently for atypical chest pain as well as for a blood infusion.   I independently reviewed and interpreted the patient's labs. Urinalysis shows hazy urine with large amount of leukocytes, 11-20 red blood cells, greater than 50  white blood cells and many bacteria.  Trichomonas present as well.  CBC shows no leukocytosis.  Hemoglobin at 10.7.  Improved from baseline.  BMP shows potassium 3.4, otherwise no electrolyte or LFT abnormality.  Lipase within normal limits.  Wet prep shows trichomoniasis, clue cells, and greater than 10 white blood cells present.  GC pending.  hCG is negative.  Troponin less than 15.  CXR shows 1. No acute cardiopulmonary abnormality. Per radiologist's interpretation.    EKG reviewed and interpreted by my attending and read as Sinus rhythm Borderline T abnormalities, anterior leads.   HEART score of 2.  No cardiac death before the age of 33.  She is PERC negative and low Wells risk.  Doubt any PE.  Given chronicity, doubt any dissection or aneurysm.  EKG grossly unchanged.  Chest x-ray unremarkable, troponins negative.  Doubt any acute ACS. She ambulated in the department and maintained saturation at 100%.  Given this is patient's second presentation with chest pain, we will give her cardiology follow-up.  The patient as positive with Trich, was positive on 08/20/2024 but reports that she didn't have  treatment. Will have her self swab for GC. She is not having any pelvic or vaginal pain.  Denies any urinary symptoms or any abnormal vaginal discharge.  Abdomen is soft and nontender.  Will treat for this as well as for the BV with Flagyl .  Urine does show contamination.  Have ordered urine culture.  Will treat with cefpodoxime .  We discussed the results of the labs/imaging. The plan is cardiology follow up, take medication as prescribed. We discussed strict return precautions and red flag symptoms. The patient verbalized their understanding and agrees to the plan. The patient is stable and being discharged home in good condition.  Portions of this report may have been transcribed using voice recognition software. Every effort was made to ensure accuracy; however, inadvertent computerized transcription errors may be present.    Final diagnoses:  Trichomoniasis  Nonspecific chest pain  Acute cystitis without hematuria    ED Discharge Orders          Ordered    Ambulatory referral to Cardiology        09/05/24 2259    metroNIDAZOLE  (FLAGYL ) 500 MG tablet  2 times daily        09/05/24 2300    cefpodoxime  (VANTIN ) 200 MG tablet  2 times daily        09/05/24 2300               Bernis Ernst, [place] 09/10/24 [id number]    Ula Prentice SAUNDERS, MD 09/12/24 [phone]  "

## 2024-09-05 NOTE — ED Triage Notes (Signed)
 Fatigue , weakness x 1 week after an iron  transfusion  N/v today Chest heaviness

## 2024-09-05 NOTE — ED Notes (Signed)
 Pt given self swab at bedside.

## 2024-09-05 NOTE — ED Notes (Signed)
 Patient tolerated ambulation well. O2 is at 100%

## 2024-09-06 LAB — WET PREP, GENITAL
Sperm: NONE SEEN
WBC, Wet Prep HPF POC: >=10 — AB
Yeast Wet Prep HPF POC: NONE SEEN

## 2024-09-06 LAB — GC/CHLAMYDIA PROBE AMP (~~LOC~~) NOT AT ARMC
Chlamydia: NEGATIVE
Comment: NEGATIVE
Comment: NORMAL
Neisseria Gonorrhea: NEGATIVE

## 2024-09-07 LAB — URINE CULTURE: Culture: <10000 — AB

## 2024-09-11 ENCOUNTER — Ambulatory Visit: Payer: Self-pay | Admitting: Physician Assistant
